# Patient Record
Sex: Male | Born: 1965 | Race: Black or African American | Hispanic: No | Marital: Single | Smoking: Current every day smoker
Health system: Southern US, Community
[De-identification: ages and names within clinical notes are randomized; demographics above are authoritative.]

## PROBLEM LIST (undated history)

## (undated) DIAGNOSIS — I10 Essential (primary) hypertension: Secondary | ICD-10-CM

## (undated) DIAGNOSIS — E119 Type 2 diabetes mellitus without complications: Secondary | ICD-10-CM

## (undated) DIAGNOSIS — K7460 Unspecified cirrhosis of liver: Secondary | ICD-10-CM

## (undated) DIAGNOSIS — M1990 Unspecified osteoarthritis, unspecified site: Secondary | ICD-10-CM

## (undated) DIAGNOSIS — J3489 Other specified disorders of nose and nasal sinuses: Secondary | ICD-10-CM

## (undated) HISTORY — PX: OTHER SURGICAL HISTORY: SHX169

---

## 2001-07-31 ENCOUNTER — Emergency Department (HOSPITAL_COMMUNITY): Admission: EM | Admit: 2001-07-31 | Discharge: 2001-07-31 | Payer: Self-pay | Admitting: Emergency Medicine

## 2001-08-07 ENCOUNTER — Ambulatory Visit (HOSPITAL_COMMUNITY): Admission: RE | Admit: 2001-08-07 | Discharge: 2001-08-07 | Payer: Self-pay | Admitting: Family Medicine

## 2001-08-07 ENCOUNTER — Encounter: Payer: Self-pay | Admitting: Family Medicine

## 2001-09-03 ENCOUNTER — Encounter: Payer: Self-pay | Admitting: Family Medicine

## 2001-09-03 ENCOUNTER — Ambulatory Visit (HOSPITAL_COMMUNITY): Admission: RE | Admit: 2001-09-03 | Discharge: 2001-09-03 | Payer: Self-pay | Admitting: Family Medicine

## 2003-09-06 DIAGNOSIS — D179 Benign lipomatous neoplasm, unspecified: Secondary | ICD-10-CM | POA: Insufficient documentation

## 2007-01-21 ENCOUNTER — Ambulatory Visit: Payer: Self-pay

## 2007-04-08 ENCOUNTER — Ambulatory Visit: Payer: Self-pay

## 2007-04-15 ENCOUNTER — Ambulatory Visit: Payer: Self-pay

## 2011-08-20 LAB — CBC
HCT: 39.8 % — ABNORMAL LOW (ref 40.0–52.0)
MCV: 87 fL (ref 80–100)
RBC: 4.56 10*6/uL (ref 4.40–5.90)
RDW: 16.5 % — ABNORMAL HIGH (ref 11.5–14.5)

## 2011-08-20 LAB — COMPREHENSIVE METABOLIC PANEL
Albumin: 4.5 g/dL (ref 3.4–5.0)
Alkaline Phosphatase: 65 U/L (ref 50–136)
BUN: 6 mg/dL — ABNORMAL LOW (ref 7–18)
Bilirubin,Total: 0.5 mg/dL (ref 0.2–1.0)
Calcium, Total: 9 mg/dL (ref 8.5–10.1)
Chloride: 105 mmol/L (ref 98–107)
Co2: 26 mmol/L (ref 21–32)
Creatinine: 1.33 mg/dL — ABNORMAL HIGH (ref 0.60–1.30)
Glucose: 95 mg/dL (ref 65–99)
Osmolality: 279 (ref 275–301)
Potassium: 3.5 mmol/L (ref 3.5–5.1)
SGOT(AST): 29 U/L (ref 15–37)
SGPT (ALT): 24 U/L
Sodium: 141 mmol/L (ref 136–145)

## 2011-08-20 LAB — SALICYLATE LEVEL: Salicylates, Serum: 2.4 mg/dL

## 2011-08-20 LAB — ETHANOL: Ethanol %: <0.003 % (ref 0.000–0.080)

## 2011-08-21 LAB — DRUG SCREEN, URINE
Amphetamines, Ur Screen: NEGATIVE (ref ?–1000)
Benzodiazepine, Ur Scrn: NEGATIVE (ref ?–200)
Cocaine Metabolite,Ur ~~LOC~~: POSITIVE (ref ?–300)
Methadone, Ur Screen: NEGATIVE (ref ?–300)
Opiate, Ur Screen: NEGATIVE (ref ?–300)
Tricyclic, Ur Screen: NEGATIVE (ref ?–1000)

## 2011-08-21 LAB — URINALYSIS, COMPLETE
Leukocyte Esterase: NEGATIVE
Ph: 5 (ref 4.5–8.0)
Specific Gravity: 1.03 (ref 1.003–1.030)
Squamous Epithelial: 2

## 2011-08-22 ENCOUNTER — Inpatient Hospital Stay: Payer: Self-pay | Admitting: Unknown Physician Specialty

## 2013-03-16 DIAGNOSIS — S41109A Unspecified open wound of unspecified upper arm, initial encounter: Secondary | ICD-10-CM | POA: Insufficient documentation

## 2013-03-20 DIAGNOSIS — S52209A Unspecified fracture of shaft of unspecified ulna, initial encounter for closed fracture: Secondary | ICD-10-CM | POA: Insufficient documentation

## 2013-03-20 DIAGNOSIS — S5290XA Unspecified fracture of unspecified forearm, initial encounter for closed fracture: Secondary | ICD-10-CM | POA: Insufficient documentation

## 2013-04-24 ENCOUNTER — Emergency Department (HOSPITAL_COMMUNITY)
Admission: EM | Admit: 2013-04-24 | Discharge: 2013-04-24 | Disposition: A | Discharge status: Home / Self Care | Payer: Self-pay | Attending: Emergency Medicine | Admitting: Emergency Medicine

## 2013-04-24 ENCOUNTER — Encounter (HOSPITAL_COMMUNITY): Payer: Self-pay | Admitting: Emergency Medicine

## 2013-04-24 DIAGNOSIS — I10 Essential (primary) hypertension: Secondary | ICD-10-CM | POA: Insufficient documentation

## 2013-04-24 DIAGNOSIS — Z87828 Personal history of other (healed) physical injury and trauma: Secondary | ICD-10-CM | POA: Insufficient documentation

## 2013-04-24 DIAGNOSIS — R42 Dizziness and giddiness: Secondary | ICD-10-CM | POA: Insufficient documentation

## 2013-04-24 DIAGNOSIS — R5381 Other malaise: Secondary | ICD-10-CM | POA: Insufficient documentation

## 2013-04-24 DIAGNOSIS — F17200 Nicotine dependence, unspecified, uncomplicated: Secondary | ICD-10-CM | POA: Insufficient documentation

## 2013-04-24 HISTORY — DX: Essential (primary) hypertension: I10

## 2013-04-24 MED ORDER — HYDROCHLOROTHIAZIDE 25 MG PO TABS: 25.0000 mg | ORAL_TABLET | Freq: Every day | ORAL | Status: DC

## 2013-04-24 NOTE — ED Provider Notes (Signed)
Medical screening examination/treatment/procedure(s) were performed by non-physician practitioner and as supervising physician I was immediately available for consultation/collaboration.  EKG Interpretation   None         Lyanne Co, MD 04/24/13 2055
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
## 2013-05-06 ENCOUNTER — Emergency Department (HOSPITAL_COMMUNITY)
Admission: EM | Admit: 2013-05-06 | Discharge: 2013-05-06 | Disposition: A | Discharge status: Home / Self Care | Payer: Self-pay | Attending: Emergency Medicine | Admitting: Emergency Medicine

## 2013-05-06 ENCOUNTER — Encounter (HOSPITAL_COMMUNITY): Payer: Self-pay | Admitting: Emergency Medicine

## 2013-05-06 ENCOUNTER — Emergency Department (HOSPITAL_COMMUNITY): Payer: Self-pay

## 2013-05-06 DIAGNOSIS — R42 Dizziness and giddiness: Secondary | ICD-10-CM | POA: Insufficient documentation

## 2013-05-06 DIAGNOSIS — Z87828 Personal history of other (healed) physical injury and trauma: Secondary | ICD-10-CM | POA: Insufficient documentation

## 2013-05-06 DIAGNOSIS — Z7982 Long term (current) use of aspirin: Secondary | ICD-10-CM | POA: Insufficient documentation

## 2013-05-06 DIAGNOSIS — I10 Essential (primary) hypertension: Secondary | ICD-10-CM | POA: Insufficient documentation

## 2013-05-06 DIAGNOSIS — L02419 Cutaneous abscess of limb, unspecified: Secondary | ICD-10-CM | POA: Insufficient documentation

## 2013-05-06 DIAGNOSIS — F17200 Nicotine dependence, unspecified, uncomplicated: Secondary | ICD-10-CM | POA: Insufficient documentation

## 2013-05-06 DIAGNOSIS — Z79899 Other long term (current) drug therapy: Secondary | ICD-10-CM | POA: Insufficient documentation

## 2013-05-06 DIAGNOSIS — IMO0001 Reserved for inherently not codable concepts without codable children: Secondary | ICD-10-CM | POA: Insufficient documentation

## 2013-05-06 DIAGNOSIS — R51 Headache: Secondary | ICD-10-CM | POA: Insufficient documentation

## 2013-05-06 MED ORDER — CEPHALEXIN 500 MG PO CAPS: 500.0000 mg | ORAL_CAPSULE | Freq: Four times a day (QID) | ORAL | Status: DC

## 2013-05-06 MED ORDER — SULFAMETHOXAZOLE-TRIMETHOPRIM 800-160 MG PO TABS: 1.0000 | ORAL_TABLET | Freq: Two times a day (BID) | ORAL | Status: AC
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
## 2013-05-06 NOTE — ED Notes (Signed)
?   Insect bite to rt leg, states it has puss and is red

## 2013-05-08 ENCOUNTER — Emergency Department (HOSPITAL_COMMUNITY)
Admission: EM | Admit: 2013-05-08 | Discharge: 2013-05-08 | Disposition: A | Discharge status: Home / Self Care | Payer: Self-pay | Attending: Emergency Medicine | Admitting: Emergency Medicine

## 2013-05-08 ENCOUNTER — Encounter (HOSPITAL_COMMUNITY): Payer: Self-pay | Admitting: Emergency Medicine

## 2013-05-08 DIAGNOSIS — F17200 Nicotine dependence, unspecified, uncomplicated: Secondary | ICD-10-CM | POA: Insufficient documentation

## 2013-05-08 DIAGNOSIS — Z79899 Other long term (current) drug therapy: Secondary | ICD-10-CM | POA: Insufficient documentation

## 2013-05-08 DIAGNOSIS — Z7982 Long term (current) use of aspirin: Secondary | ICD-10-CM | POA: Insufficient documentation

## 2013-05-08 DIAGNOSIS — L0291 Cutaneous abscess, unspecified: Secondary | ICD-10-CM

## 2013-05-08 DIAGNOSIS — I10 Essential (primary) hypertension: Secondary | ICD-10-CM | POA: Insufficient documentation

## 2013-05-08 DIAGNOSIS — L02419 Cutaneous abscess of limb, unspecified: Secondary | ICD-10-CM | POA: Insufficient documentation

## 2013-05-08 DIAGNOSIS — Z792 Long term (current) use of antibiotics: Secondary | ICD-10-CM | POA: Insufficient documentation

## 2013-05-08 DIAGNOSIS — Z87828 Personal history of other (healed) physical injury and trauma: Secondary | ICD-10-CM | POA: Insufficient documentation

## 2013-05-08 NOTE — ED Provider Notes (Signed)
Medical screening examination/treatment/procedure(s) were performed by non-physician practitioner and as supervising physician I was immediately available for consultation/collaboration.  EKG Interpretation   None         William Trinadee Verhagen, MD 05/08/13 2249 

## 2013-05-08 NOTE — ED Notes (Signed)
Patient states he was seen in the ED 2 days ago for an abscess to the right lower leg. Patient states he was told to come back for a recheck.
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
## 2013-05-11 NOTE — ED Provider Notes (Signed)
Medical screening examination/treatment/procedure(s) were conducted as a shared visit with non-physician practitioner(s) and myself.  I personally evaluated the patient during the encounter.  EKG Interpretation   None       Patient seen and evaluated. Bedside ultrasound shows small fluid collection. He has obvious chronic cellulitis. The calf itself is not until further tests to suggest compartment. His peripheral exam is normal normal sensation and use pulses and cap refill. Recommendation and treatment plan is as above with incision and drainage package antibiotics early recheck.  Rolland Porter, MD 05/11/13 2300

## 2013-05-18 NOTE — ED Provider Notes (Signed)
Medical screening examination/treatment/procedure(s) were conducted as a shared visit with non-physician practitioner(s) and myself.  I personally evaluated the patient during the encounter.  EKG Interpretation   None         Rolland Porter, MD 05/18/13 [phone]

## 2013-05-26 ENCOUNTER — Ambulatory Visit: Payer: Self-pay | Admitting: Occupational Therapy

## 2013-05-28 ENCOUNTER — Ambulatory Visit: Payer: Self-pay | Admitting: Occupational Therapy

## 2013-06-11 ENCOUNTER — Ambulatory Visit: Payer: Self-pay | Attending: *Deleted | Admitting: Occupational Therapy

## 2013-06-11 DIAGNOSIS — IMO0001 Reserved for inherently not codable concepts without codable children: Secondary | ICD-10-CM | POA: Insufficient documentation

## 2013-06-11 DIAGNOSIS — M6281 Muscle weakness (generalized): Secondary | ICD-10-CM | POA: Insufficient documentation

## 2013-06-11 DIAGNOSIS — M2560 Stiffness of unspecified joint, not elsewhere classified: Secondary | ICD-10-CM | POA: Insufficient documentation

## 2013-06-11 DIAGNOSIS — M2550 Pain in unspecified joint: Secondary | ICD-10-CM | POA: Insufficient documentation

## 2013-06-11 DIAGNOSIS — R279 Unspecified lack of coordination: Secondary | ICD-10-CM | POA: Insufficient documentation

## 2013-06-18 ENCOUNTER — Emergency Department: Payer: Self-pay | Admitting: Internal Medicine

## 2013-07-03 ENCOUNTER — Encounter: Payer: Self-pay | Admitting: *Deleted

## 2013-07-19 ENCOUNTER — Encounter: Payer: Self-pay | Admitting: *Deleted

## 2013-07-24 ENCOUNTER — Emergency Department: Payer: Self-pay | Admitting: Emergency Medicine

## 2013-07-24 LAB — BASIC METABOLIC PANEL
Anion Gap: 6 — ABNORMAL LOW (ref 7–16)
BUN: 7 mg/dL (ref 7–18)
CALCIUM: 9.2 mg/dL (ref 8.5–10.1)
CO2: 27 mmol/L (ref 21–32)
Chloride: 104 mmol/L (ref 98–107)
Creatinine: 1.21 mg/dL (ref 0.60–1.30)
EGFR (African American): >60
Glucose: 78 mg/dL (ref 65–99)
OSMOLALITY: 271 (ref 275–301)
Potassium: 3.9 mmol/L (ref 3.5–5.1)
SODIUM: 137 mmol/L (ref 136–145)

## 2013-07-24 LAB — CBC
HCT: 39.9 % — AB (ref 40.0–52.0)
HGB: 13.3 g/dL (ref 13.0–18.0)
MCH: 26.8 pg (ref 26.0–34.0)
MCHC: 33.3 g/dL (ref 32.0–36.0)
MCV: 81 fL (ref 80–100)
PLATELETS: 278 10*3/uL (ref 150–440)
RBC: 4.96 10*6/uL (ref 4.40–5.90)
RDW: 17.7 % — ABNORMAL HIGH (ref 11.5–14.5)
WBC: 5.5 10*3/uL (ref 3.8–10.6)

## 2013-07-24 LAB — TROPONIN I: Troponin-I: <0.02 ng/mL

## 2013-08-19 ENCOUNTER — Encounter: Payer: Self-pay | Admitting: *Deleted

## 2013-09-09 DIAGNOSIS — S52209B Unspecified fracture of shaft of unspecified ulna, initial encounter for open fracture type I or II: Secondary | ICD-10-CM | POA: Insufficient documentation

## 2013-09-09 DIAGNOSIS — S52309B Unspecified fracture of shaft of unspecified radius, initial encounter for open fracture type I or II: Secondary | ICD-10-CM | POA: Insufficient documentation

## 2013-09-18 ENCOUNTER — Encounter: Payer: Self-pay | Admitting: *Deleted

## 2013-11-12 DIAGNOSIS — E559 Vitamin D deficiency, unspecified: Secondary | ICD-10-CM | POA: Insufficient documentation

## 2014-08-01 ENCOUNTER — Emergency Department: Payer: Self-pay | Admitting: Internal Medicine

## 2014-08-01 ENCOUNTER — Ambulatory Visit: Payer: Self-pay | Admitting: Emergency Medicine
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
## 2014-09-12 NOTE — Consult Note (Signed)
PATIENT NAME:  Donald Oconnell, Donald Oconnell MR#:  [id number] DATE OF BIRTH:  August 18, 1965  DATE OF CONSULTATION:  08/20/2011  REFERRING PHYSICIAN:  Lowella Fairy, MD CONSULTING PHYSICIAN:  Venida Jarvis, MD  CHIEF COMPLAINT: "I'm not happy about life."   HISTORY OF PRESENT ILLNESS: The patient is a 49 year old black, single male transported to [facility] via police. He reported initially to the police he has no reason to live and was tired of life. He was slightly disheveled on admission with some poor hygiene and was noted to be vague with no psychotic-like symptoms. The patient reports to me substance abuse problems and chronic depression for most of his life with symptoms including decreased sleep, interest is questionable, concentration is down, appetite okay, energy is "I don't know", and for suicidal thoughts he reports he just cannot get rid of the pain and that he will do something, but does not have a particular plan and will not tell me very much more about it.   The patient has a long-term history of cocaine abuse, usually crack cocaine, dating back at least 20 years. I saw him 15 years ago during a hospitalization for cocaine. At that time he was irritable in the hospital with some difficulty controlling him. He reports using crack every three days about 2 grams or 100 dollars every three days. He drinks about a beer every 2 to 3 weeks and occasionally uses synthetic marijuana, but not very often. He has had several medication trials. I had him on Wellbutrin several years ago and he reports that did not help very much, but he does not remember for sure. According to my records during the hospitalization, he did get somewhat better with it. He also has questionable history of ADHD. He has history of personality disorder and probably going down hill. In addition, prior to precipitating event for hospitalization was that the patient had to sign his car over to drug dealers in order  to mollify them for medicine he owes.  FAMILY HISTORY: According to the patient is negative.   PAST HISTORY:  1. Hypertension on hydrochlorothiazide. 2. Psoriasis for which he takes apparently triamcinolone cream occasionally.   SOCIAL HISTORY: The patient has been in prison before for some drug charges as well as assault. He is currently not on probation, according to him. He has a son who he does not see currently. No recent hobbies. In the past, he enjoyed basketball and football. Formerly worked at [employer], now apparently not working. He lives by himself in a boarding house. He has a high school education.   MENTAL STATUS: This is a black male who looked his stated age, pretty vague in the interview with a slight depressed affect and started with at least somewhat of a manipulative tone. He was however well-groomed, aware of his surroundings, and able to sustain attention and change focus of attention. He was oriented x4, remembered three of three objects at 1 and 3 minutes.   IMPRESSION:   AXIS I:  1. Depressive disorder, not otherwise specified.  2. Cocaine dependence.   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   AXIS III: Psoriasis and hypertension.   ASSESSMENT AND PLAN: The patient does have some vague suicidal thinking, appears to be at the end of his rope at this point, also manipulative and probably somewhat sociopathic. With history of violence, I think we need to leave commitment papers on him and at least make an attempt  to [facility]. In the meantime, I will put him on some Wellbutrin as well as Remeron. A double dose may help some with depression. It is possible he could clear within a day or two. We will also place the patient back on hydrochlorothiazide. He probably is too potentially violent to be admitted to the psychiatric unit here.  ____________________________ Venida JarvisWilliam James Ryan II, MD wjr:slb D: 08/20/2011  14:51:52 ET     T: 08/20/2011 15:55:15 ET        JOB#: [id number] cc: Venida JarvisWilliam James Ryan II, MD, <Dictator> Jules HusbandsWILLIAM J RYAN MD ELECTRONICALLY SIGNED 08/23/2011 14:51

## 2014-09-12 NOTE — Discharge Summary (Signed)
PATIENT NAME:  Donald Oconnell, Donald Oconnell MR#:  [id number] DATE OF BIRTH:  [birth year]  DATE OF ADMISSION:  08/22/2011 DATE OF DISCHARGE:  08/24/2011  HISTORY OF PRESENT ILLNESS: The patient was admitted with depressive disorder, not otherwise specified, and cocaine dependence. For further details, please see typed attached History and Physical.   ACCESSORY CLINICAL DATA:  Acetaminophen was negative.  BUN 6, creatinine 1.33, otherwise kidneys within normal limits. Glucose, electrolytes, liver function tests within normal limits. Calcium within normal limits.  Ethanol was negative.  CBC within normal limits.  Salicylates 2.4. TSH 0.630, within normal limits.  Drug screen positive for cocaine.  Urinalysis showed 1+ bilirubin, 30 mg of protein, otherwise negative.   HOSPITAL COURSE: The patient was begun on Wellbutrin 150 and then Remeron 15, which was eventually increased to 30. Over the course of the hospitalization, depressive symptoms responded and he became a good bit less manipulative and indecisive. At the time of discharge, he continued to want help with both depression and with the problem with cocaine, and he also desperately was trying look for a job. He did agree to attend [facility], had no suicidal thinking. Interest and energy were much better at the time of discharge.   DIAGNOSES:  AXIS I:  1. Depressive disorder, not otherwise specified.  2. Cocaine dependence.   AXIS III:  1. Hypertension.  2. Psoriasis.   CONDITION ON DISCHARGE: Good.  DISPOSITION: The patient will be seen at [facility] at [facility].   DISCHARGE MEDICATIONS:  1. Wellbutrin SR 150 daily.  2. Remeron 30 mg at bedtime. 3. Hydrochlorothiazide 25 mg daily.   DIET: As tolerated.  ACTIVITY: As tolerated.  ____________________________ Venida JarvisWilliam James Mubashir Mallek II, MD wjr:cbb D: 08/24/2011 12:52:59 ET T: 08/24/2011 13:41:23 ET JOB#: [id number]  cc: Venida JarvisWilliam James Sutton Hirsch II, MD, <Dictator> Jules HusbandsWILLIAM J Patrick Salemi MD ELECTRONICALLY  SIGNED 08/28/2011 15:35

## 2014-09-13 IMAGING — CR DG TIBIA/FIBULA 2V*R*
4 series · 4 of 4 positions shown · non-contrast
Comparison: None.

CLINICAL DATA: Insect bite.  Evaluate for free air.

EXAM:
RIGHT TIBIA AND FIBULA - 2 VIEW

[x tib-fib ap right (1 of 2)]
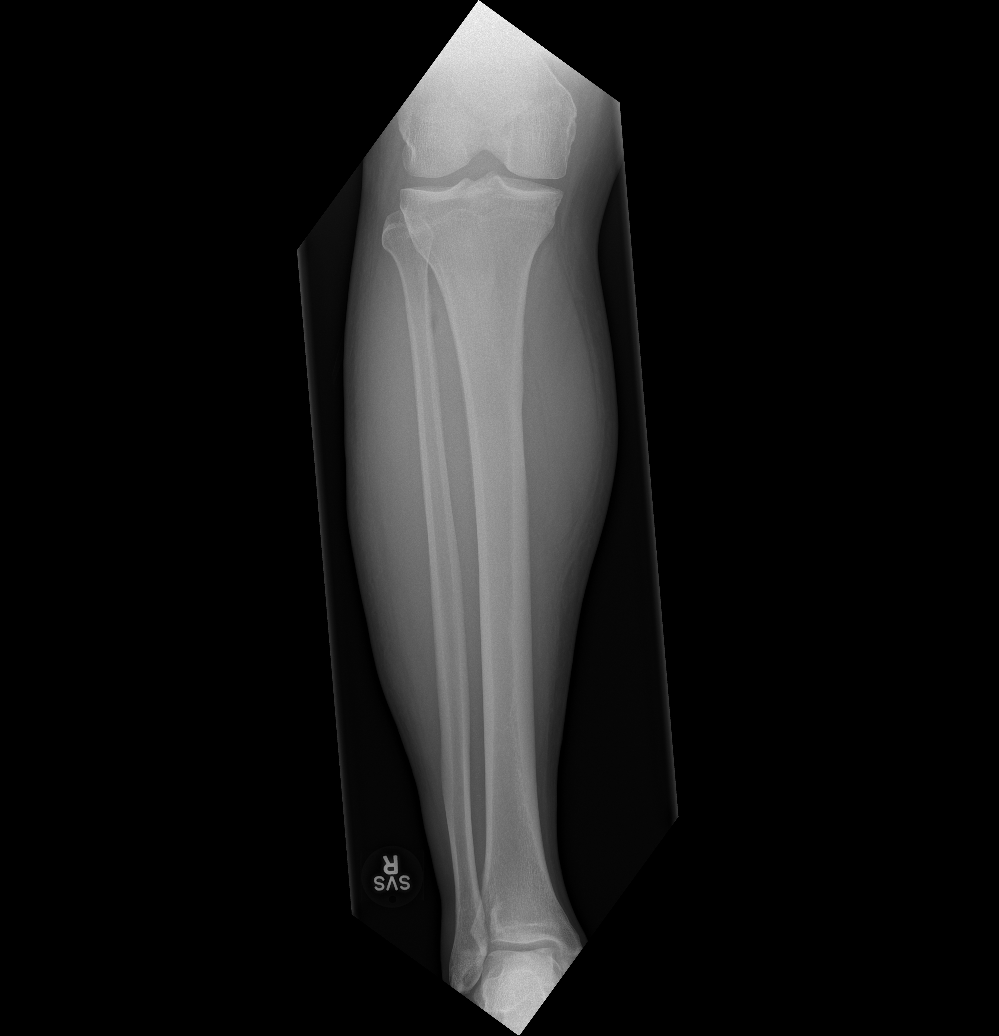

[x tib-fib ap right (2 of 2)]
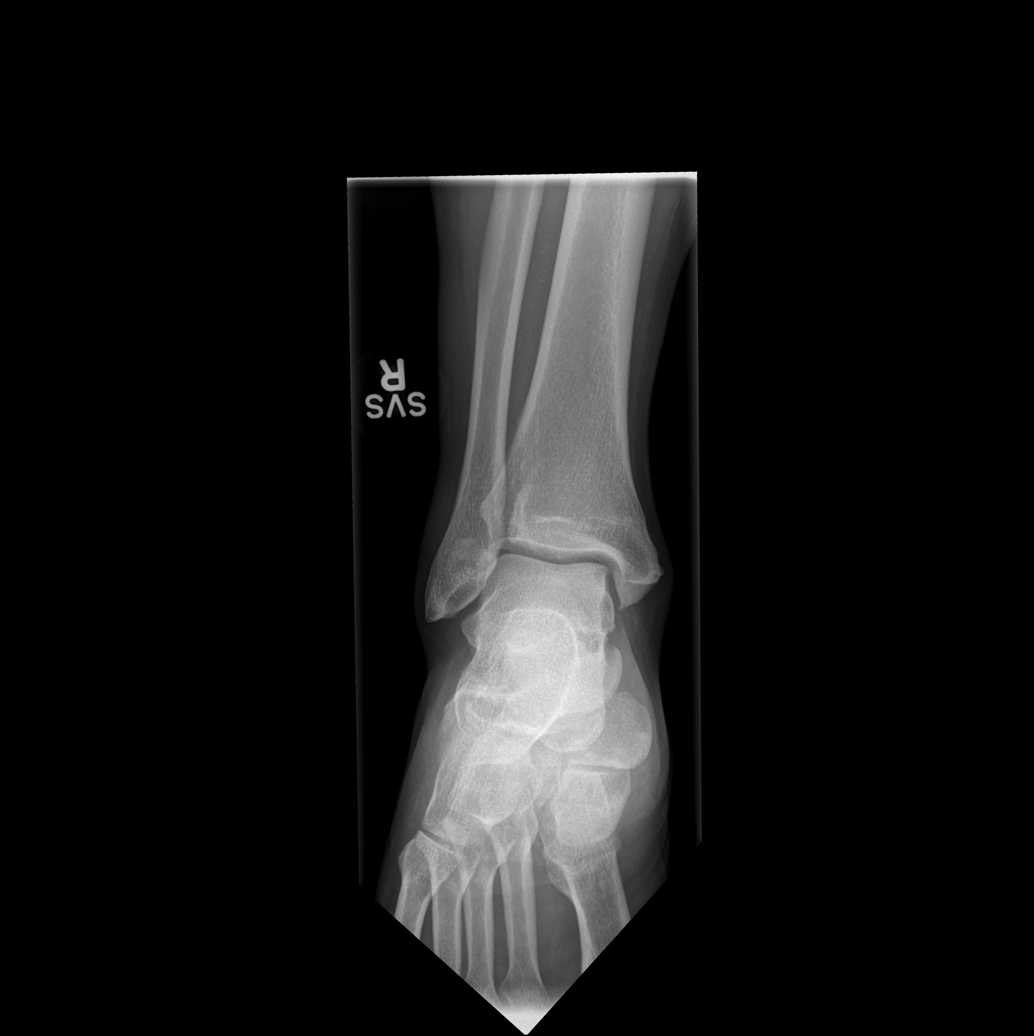

[x tib-fib lat right (1 of 2)]
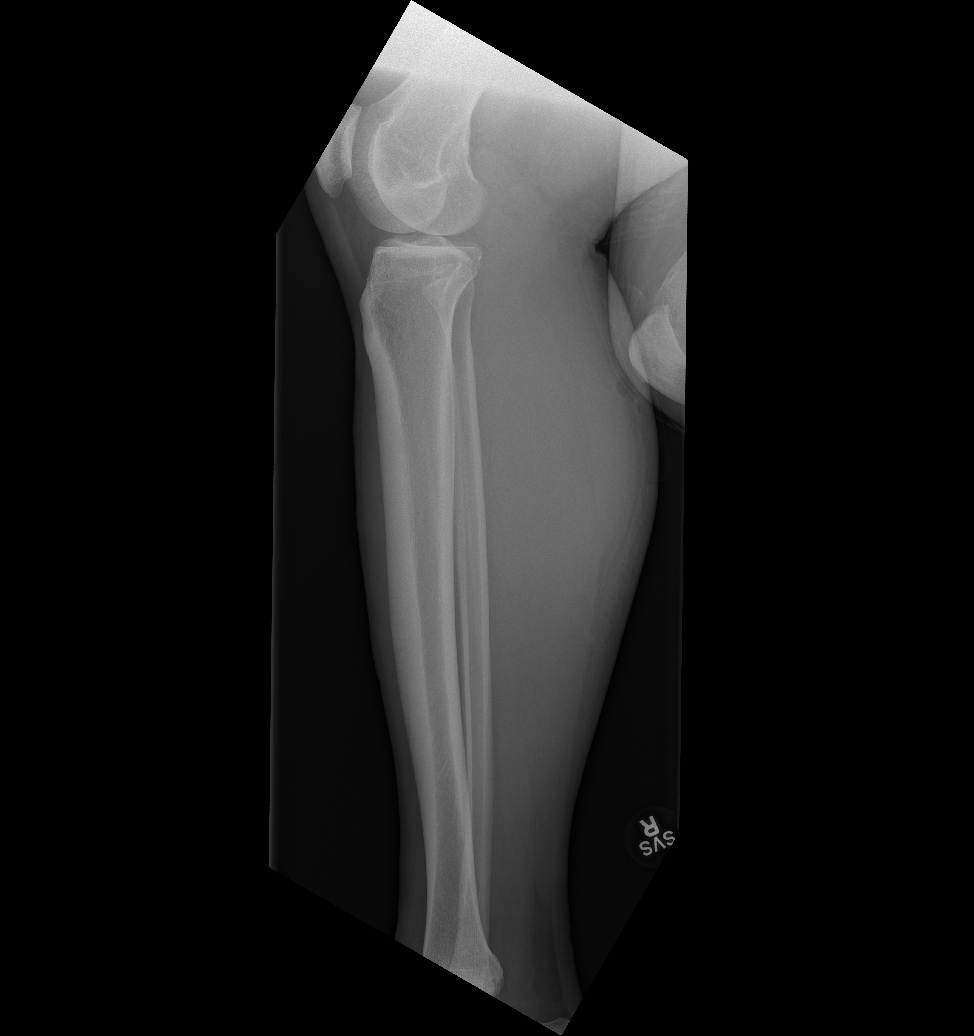

[x tib-fib lat right (2 of 2)]
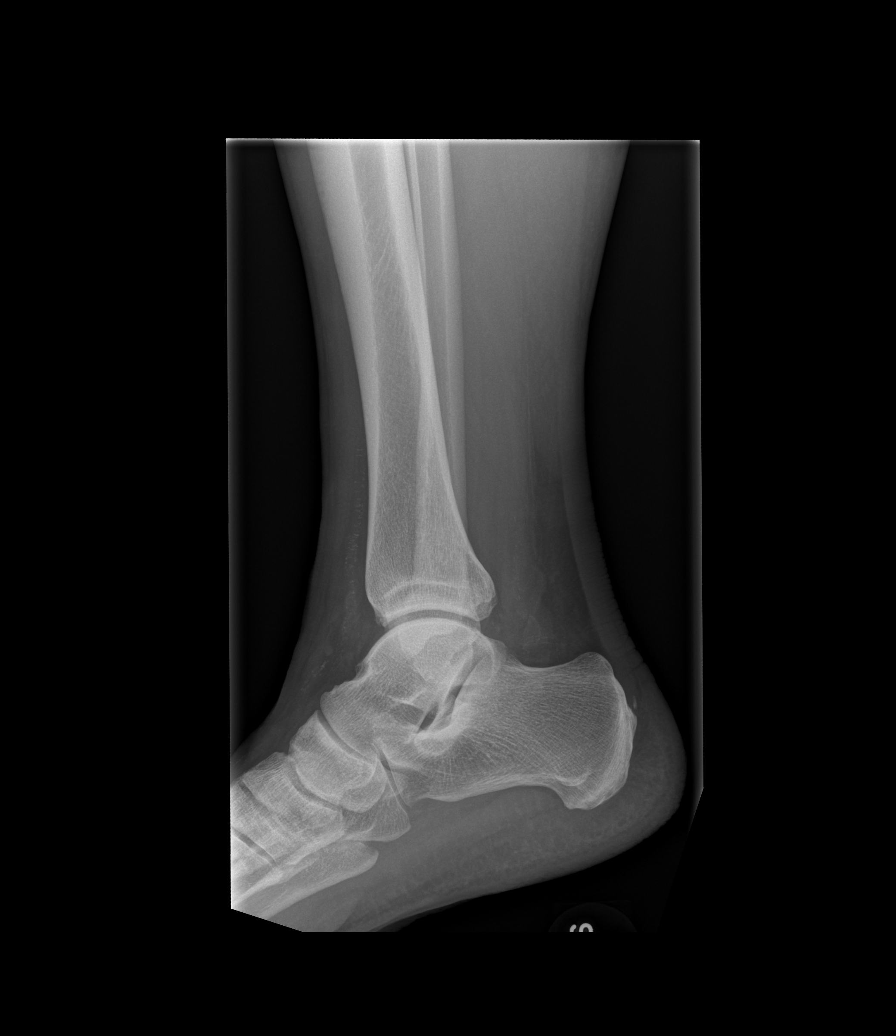

[4 of 4 positions shown; findings below may reference images not displayed]

FINDINGS: 13 mm locule of subcutaneous gas in the upper calf. There is soft
tissue reticulation in this region. No evidence of osseous
infection. No fracture. Arterial calcification.

These results were called by telephone at the time of interpretation
on 05/06/2013 at [DATE] to Dr. VAZQUEZ BOULWARE , who verbally
acknowledged these results.
IMPRESSION: 1. Cellulitic change with single locule of subcutaneous gas in the
upper calf.
2. Negative for osseous infection.

## 2015-01-25 ENCOUNTER — Emergency Department
Admission: EM | Admit: 2015-01-25 | Discharge: 2015-01-26 | Disposition: A | Discharge status: Other Acute Inpatient Hospital | Payer: Self-pay | Attending: Emergency Medicine | Admitting: Emergency Medicine

## 2015-01-25 ENCOUNTER — Emergency Department: Payer: Self-pay

## 2015-01-25 ENCOUNTER — Encounter: Payer: Self-pay | Admitting: Emergency Medicine

## 2015-01-25 DIAGNOSIS — Z79899 Other long term (current) drug therapy: Secondary | ICD-10-CM | POA: Insufficient documentation

## 2015-01-25 DIAGNOSIS — Y9389 Activity, other specified: Secondary | ICD-10-CM | POA: Insufficient documentation

## 2015-01-25 DIAGNOSIS — L409 Psoriasis, unspecified: Secondary | ICD-10-CM

## 2015-01-25 DIAGNOSIS — E1159 Type 2 diabetes mellitus with other circulatory complications: Secondary | ICD-10-CM

## 2015-01-25 DIAGNOSIS — F1994 Other psychoactive substance use, unspecified with psychoactive substance-induced mood disorder: Secondary | ICD-10-CM

## 2015-01-25 DIAGNOSIS — I10 Essential (primary) hypertension: Secondary | ICD-10-CM

## 2015-01-25 DIAGNOSIS — Y998 Other external cause status: Secondary | ICD-10-CM | POA: Insufficient documentation

## 2015-01-25 DIAGNOSIS — X58XXXA Exposure to other specified factors, initial encounter: Secondary | ICD-10-CM | POA: Insufficient documentation

## 2015-01-25 DIAGNOSIS — F1210 Cannabis abuse, uncomplicated: Secondary | ICD-10-CM | POA: Insufficient documentation

## 2015-01-25 DIAGNOSIS — Y9289 Other specified places as the place of occurrence of the external cause: Secondary | ICD-10-CM | POA: Insufficient documentation

## 2015-01-25 DIAGNOSIS — F1410 Cocaine abuse, uncomplicated: Secondary | ICD-10-CM

## 2015-01-25 DIAGNOSIS — F22 Delusional disorders: Secondary | ICD-10-CM | POA: Insufficient documentation

## 2015-01-25 DIAGNOSIS — Z720 Tobacco use: Secondary | ICD-10-CM | POA: Insufficient documentation

## 2015-01-25 DIAGNOSIS — S62630A Displaced fracture of distal phalanx of right index finger, initial encounter for closed fracture: Secondary | ICD-10-CM | POA: Insufficient documentation

## 2015-01-25 DIAGNOSIS — Z7982 Long term (current) use of aspirin: Secondary | ICD-10-CM | POA: Insufficient documentation

## 2015-01-25 DIAGNOSIS — F341 Dysthymic disorder: Secondary | ICD-10-CM

## 2015-01-25 DIAGNOSIS — R443 Hallucinations, unspecified: Secondary | ICD-10-CM

## 2015-01-25 LAB — COMPREHENSIVE METABOLIC PANEL
ALBUMIN: 4.6 g/dL (ref 3.5–5.0)
ALT: 19 U/L (ref 17–63)
AST: 27 U/L (ref 15–41)
Alkaline Phosphatase: 71 U/L (ref 38–126)
Anion gap: 10 (ref 5–15)
BUN: 13 mg/dL (ref 6–20)
CHLORIDE: 107 mmol/L (ref 101–111)
CO2: 24 mmol/L (ref 22–32)
Calcium: 9.5 mg/dL (ref 8.9–10.3)
Creatinine, Ser: 1.57 mg/dL — ABNORMAL HIGH (ref 0.61–1.24)
GFR calc Af Amer: 58 mL/min — ABNORMAL LOW (ref >60)
GFR, EST NON AFRICAN AMERICAN: 50 mL/min — AB (ref >60)
GLUCOSE: 92 mg/dL (ref 65–99)
POTASSIUM: 3.5 mmol/L (ref 3.5–5.1)
Sodium: 141 mmol/L (ref 135–145)
Total Bilirubin: 0.6 mg/dL (ref 0.3–1.2)
Total Protein: 8.3 g/dL — ABNORMAL HIGH (ref 6.5–8.1)

## 2015-01-25 LAB — CBC
HCT: 43.1 % (ref 40.0–52.0)
Hemoglobin: 14.2 g/dL (ref 13.0–18.0)
MCH: 27.9 pg (ref 26.0–34.0)
MCHC: 33 g/dL (ref 32.0–36.0)
MCV: 84.6 fL (ref 80.0–100.0)
PLATELETS: 304 10*3/uL (ref 150–440)
RBC: 5.09 MIL/uL (ref 4.40–5.90)
RDW: 17.3 % — AB (ref 11.5–14.5)
WBC: 7.7 10*3/uL (ref 3.8–10.6)

## 2015-01-25 LAB — TROPONIN I

## 2015-01-25 LAB — URINE DRUG SCREEN, QUALITATIVE (ARMC ONLY)
AMPHETAMINES, UR SCREEN: NOT DETECTED
BENZODIAZEPINE, UR SCRN: NOT DETECTED
Barbiturates, Ur Screen: NOT DETECTED
Cannabinoid 50 Ng, Ur ~~LOC~~: POSITIVE — AB
Cocaine Metabolite,Ur ~~LOC~~: POSITIVE — AB
MDMA (Ecstasy)Ur Screen: NOT DETECTED
METHADONE SCREEN, URINE: NOT DETECTED
Opiate, Ur Screen: NOT DETECTED
Phencyclidine (PCP) Ur S: NOT DETECTED
Tricyclic, Ur Screen: NOT DETECTED

## 2015-01-25 LAB — ETHANOL: Alcohol, Ethyl (B): <5 mg/dL (ref <5)

## 2015-01-25 LAB — LIPASE, BLOOD: Lipase: 20 U/L — ABNORMAL LOW (ref 22–51)

## 2015-01-25 MED ORDER — LORAZEPAM 2 MG PO TABS
ORAL_TABLET | ORAL | Status: AC
Start: 2015-01-25 — End: 2015-01-26
  Filled 2015-01-25: qty 1

## 2015-01-25 MED ORDER — LORAZEPAM 2 MG PO TABS
2.0000 mg | ORAL_TABLET | Freq: Once | ORAL | Status: AC
  Administered 2015-01-25: 2 mg via ORAL

## 2015-01-25 MED ORDER — MIRTAZAPINE 15 MG PO TABS
30.0000 mg | ORAL_TABLET | Freq: Every day | ORAL | Status: DC
  Administered 2015-01-25: 30 mg via ORAL

## 2015-01-25 MED ORDER — HYDROCHLOROTHIAZIDE 25 MG PO TABS
ORAL_TABLET | ORAL | Status: AC
  Filled 2015-01-25: qty 2

## 2015-01-25 MED ORDER — QUETIAPINE FUMARATE 25 MG PO TABS
ORAL_TABLET | ORAL | Status: AC
  Filled 2015-01-25: qty 2

## 2015-01-25 MED ORDER — LORAZEPAM 1 MG PO TABS
ORAL_TABLET | ORAL | Status: AC
  Administered 2015-01-25: 10:00:00 2 mg via ORAL
  Filled 2015-01-25: qty 2

## 2015-01-25 MED ORDER — HYDROCHLOROTHIAZIDE 50 MG PO TABS
50.0000 mg | ORAL_TABLET | Freq: Every day | ORAL | Status: DC
  Administered 2015-01-25 – 2015-01-26 (×2): 50 mg via ORAL
  Filled 2015-01-25: qty 1

## 2015-01-25 MED ORDER — QUETIAPINE FUMARATE 25 MG PO TABS
50.0000 mg | ORAL_TABLET | Freq: Every day | ORAL | Status: DC
  Administered 2015-01-25: 50 mg via ORAL

## 2015-01-25 MED ORDER — MIRTAZAPINE 15 MG PO TABS
ORAL_TABLET | ORAL | Status: AC
  Filled 2015-01-25: qty 2

## 2015-01-25 NOTE — ED Notes (Signed)
Intake in to see pt at this time.

## 2015-01-25 NOTE — ED Notes (Signed)
Pt given breakfast tray, pt drank juice

## 2015-01-25 NOTE — ED Notes (Signed)
Entered in error wrong patient.  

## 2015-01-25 NOTE — ED Notes (Signed)
BEHAVIORAL HEALTH ROUNDING Patient sleeping: Yes.   Patient alert and oriented: yes Behavior appropriate: Yes.  ; If no, describe:  Nutrition and fluids offered: Yes  Toileting and hygiene offered: Yes  Sitter present: yes Law enforcement present: Yes  

## 2015-01-25 NOTE — ED Notes (Signed)
BEHAVIORAL HEALTH ROUNDING Patient sleeping: No. Patient alert and oriented: yes Behavior appropriate: Yes.  ; If no, describe:  Nutrition and fluids offered: Yes  Toileting and hygiene offered: Yes  Sitter present: yes Law enforcement present: Yes  

## 2015-01-25 NOTE — ED Notes (Signed)
Pt states "I hear that security officer talking to me, I want to leave, talking about me like I am a piece of trash", pt states he feels anxious and asked for something to calm him down, MD notified, PO ativan given

## 2015-01-25 NOTE — ED Notes (Signed)
CIWA entered in error - wrong patient.
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
## 2015-01-25 NOTE — BHH Counselor (Signed)
Counselor attempted to see PT to complete assessment. PT was sleeping. PT's nurse Maxine Glenn, RN) will contact TTS when he awakes.

## 2015-01-25 NOTE — ED Notes (Signed)
Dr Toni Amend in to eval

## 2015-01-25 NOTE — ED Notes (Signed)

## 2015-01-25 NOTE — ED Notes (Signed)
BEHAVIORAL HEALTH ROUNDING Patient sleeping: Yes.   Patient alert and oriented: yes Behavior appropriate: Yes.  ; If no, describe:  Nutrition and fluids offered: Yes  Toileting and hygiene offered: Yes  Sitter present: not applicable Law enforcement present: Yes   15 minute checks maintained for safety. 

## 2015-01-25 NOTE — ED Notes (Signed)
Meal tray given 

## 2015-01-25 NOTE — ED Notes (Signed)
Per [facility], pt noted attempting to remove IV from site. Quad RN placed bandage over site to maintain IV line. No other behaviors noted.

## 2015-01-25 NOTE — ED Notes (Signed)
ED BHU PLACEMENT JUSTIFICATION Is the patient under IVC or is there intent for IVC: Yes.   Is the patient medically cleared: Yes.   Is there vacancy in the ED BHU: Yes.   Is the population mix appropriate for patient: Yes.   Is the patient awaiting placement in inpatient or outpatient setting: Yes.   Has the patient had a psychiatric consult: No. Survey of unit performed for contraband, proper placement and condition of furniture, tampering with fixtures in bathroom, shower, and each patient room: Yes.  ; Findings:  APPEARANCE/BEHAVIOR calm, cooperative and adequate rapport can be established NEURO ASSESSMENT Orientation: time, place and person Hallucinations: No.None noted (Hallucinations) Speech: Normal Gait: normal RESPIRATORY ASSESSMENT Normal expansion.  Clear to auscultation.  No rales, rhonchi, or wheezing. CARDIOVASCULAR ASSESSMENT regular rate and rhythm, S1, S2 normal, no murmur, click, rub or gallop GASTROINTESTINAL ASSESSMENT soft, nontender, BS WNL, no r/g EXTREMITIES normal strength, tone, and muscle mass PLAN OF CARE Provide calm/safe environment. Vital signs assessed twice daily. ED BHU Assessment once each 12-hour shift. Collaborate with intake RN daily or as condition indicates. Assure the ED provider has rounded once each shift. Provide and encourage hygiene. Provide redirection as needed. Assess for escalating behavior; address immediately and inform ED provider.  Assess family dynamic and appropriateness for visitation as needed: Yes.  ; If necessary, describe findings:  Educate the patient/family about BHU procedures/visitation: Yes.  ; If necessary, describe findings:  

## 2015-01-25 NOTE — ED Notes (Addendum)
Patient cooperative with nursing assessment but still easily irritated and paranoid. Received Ativan 2 mg per MD order to assist with reducing agitation.  Will maintain on 15 minute checks for safety.

## 2015-01-25 NOTE — ED Notes (Signed)
Pt arrives via EMS after calling 911 stating that he had taken cocaine.  States he felt as if he was going to die.  Pt also had taken unknown substances.  Pt combative with EMS.  Dr. Zenda Alpers at bedside.

## 2015-01-25 NOTE — ED Notes (Signed)

## 2015-01-25 NOTE — ED Notes (Signed)
BEHAVIORAL HEALTH ROUNDING Patient sleeping: Yes.   Patient alert and oriented: yes Behavior appropriate: Yes.  ; If no, describe:  Nutrition and fluids offered: Yes  Toileting and hygiene offered: Yes  Sitter present: not applicable Law enforcement present: Yes  

## 2015-01-25 NOTE — ED Notes (Signed)
BEHAVIORAL HEALTH ROUNDING Patient sleeping: No. Patient alert and oriented: yes Behavior appropriate: Yes.   Nutrition and fluids offered: Yes  Toileting and hygiene offered: Yes  Sitter present: yes Law enforcement present: Yes   ENVIRONMENTAL ASSESSMENT Potentially harmful objects out of patient reach: Yes.   Personal belongings secured: Yes.   Patient dressed in hospital provided attire only: Yes.   Plastic bags out of patient reach: Yes.   Patient care equipment (cords, cables, call bells, lines, and drains) shortened, removed, or accounted for: Yes.   Equipment and supplies removed from bottom of stretcher: Yes.   Potentially toxic materials out of patient reach: Yes.   Sharps container removed or out of patient reach: Yes.      Pt given apple juice, when asked if he was hearing voices pt stated "Its hard to explain", when asked about SI pt states "With all the police out there I dont think so", at this time pt calm, claiming a headache

## 2015-01-25 NOTE — ED Notes (Signed)
Pt states that he hears voices and they talk to him telling him that he is going to die.  Pt states that he also talks back to the voices in his head.  Pt overhead saying that someone "put something in his cocaine".

## 2015-01-25 NOTE — ED Notes (Signed)
Patient resting comfortably in room. No complaints or concerns voiced. No distress or abnormal behavior noted. Will continue to monitor with security cameras. Q 15 minute rounds continue. 

## 2015-01-25 NOTE — ED Notes (Signed)
Pt moved to BHU 2. Report given to Amy RN from BMU.

## 2015-01-25 NOTE — ED Notes (Signed)
BEHAVIORAL HEALTH ROUNDING Patient sleeping: Yes.   Patient alert and oriented: yes Behavior appropriate: Yes.   Nutrition and fluids offered: Yes  Toileting and hygiene offered: Yes  Sitter present: yes Law enforcement present: Yes   Pt resting in bed, respirations even and unlabored

## 2015-01-25 NOTE — ED Notes (Signed)
BEHAVIORAL HEALTH ROUNDING Patient sleeping: yes Patient alert and oriented: no pt sleeping Behavior appropriate: Yes.  ; If no, describe:  Nutrition and fluids offered: Yes  Toileting and hygiene offered: Yes  Sitter present: yes Law enforcement present: Yes

## 2015-01-25 NOTE — ED Notes (Signed)
Pt co right 2nd finger pain, states "I jammed it".  Slight swelling noted, states wants xray in am.  Order given by md will send for xray in am.

## 2015-01-25 NOTE — ED Notes (Signed)
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
## 2015-01-25 NOTE — ED Notes (Signed)
Pt transferred into ED BHU room 2    Patient assigned to appropriate care area. Patient oriented to unit/care area: Informed that, for their safety, care areas are designed for safety and monitored by security cameras at all times; Visiting hours and phone times explained to patient. Patient verbalizes understanding, and verbal contract for safety obtained.   Psoriasis noted on extremities  - pt verbalizes that he takes a pill and has some cream for it - scripts filled at [facility] [address]
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
## 2015-01-25 NOTE — ED Notes (Signed)
Report received from [facility] RN. Patient care assumed. Patient/RN introduction complete. Pt sitting in day room, no distress or complaints at this time.  Pt awaiting to be seen by psychiatrist, Will continue to monitor.

## 2015-01-25 NOTE — ED Notes (Signed)
Pt IVC.  Engineer, materials outside room 17.

## 2015-01-26 ENCOUNTER — Emergency Department: Payer: Self-pay

## 2015-01-26 NOTE — ED Notes (Signed)

## 2015-01-26 NOTE — ED Notes (Signed)
No change in condition, will continue to monitor.  

## 2015-01-26 NOTE — ED Notes (Signed)
Pt C/O pain and swelling to his R index finger. Pt states he is unable to bend his finger. Swelling noted at this time and pt is unable to bend his finger.

## 2015-01-26 NOTE — ED Notes (Signed)
BEHAVIORAL HEALTH ROUNDING Patient sleeping: No. Patient alert and oriented: yes Behavior appropriate: Yes.  ; If no, describe:  Nutrition and fluids offered: Yes  Toileting and hygiene offered: Yes  Sitter present: no Law enforcement present: Yes  

## 2015-01-26 NOTE — ED Notes (Signed)
BEHAVIORAL HEALTH ROUNDING Patient sleeping: No. Patient alert and oriented: yes Behavior appropriate: Yes.  ; If no, describe:  Nutrition and fluids offered: Yes Toileting and hygiene offered: Yes  Sitter present: yes Law enforcement present: Yes ODS  

## 2015-01-26 NOTE — Discharge Instructions (Signed)
Please leave your splint in place, call the number provided for orthopedics to arrange a follow-up appointment. Please follow-up with [facility] regarding substance abuse and depression. Return to the emergency department if you're having any thoughts of hurting herself or anybody else so that we may attempt to help you.   Finger Fracture Fractures of fingers are breaks in the bones of the fingers. There are many types of fractures. There are different ways of treating these fractures. Your health care provider will discuss the best way to treat your fracture. CAUSES Traumatic injury is the main cause of broken fingers. These include:  Injuries while playing sports.  Workplace injuries.  Falls. RISK FACTORS Activities that can increase your risk of finger fractures include:  Sports.  Workplace activities that involve machinery.  A condition called osteoporosis, which can make your bones less dense and cause them to fracture more easily. SIGNS AND SYMPTOMS The main symptoms of a broken finger are pain and swelling within 15 minutes after the injury. Other symptoms include:  Bruising of your finger.  Stiffness of your finger.  Numbness of your finger.  Exposed bones (compound fracture) if the fracture is severe. DIAGNOSIS  The best way to diagnose a broken bone is with X-ray imaging. Additionally, your health care provider will use this X-ray image to evaluate the position of the broken finger bones.  TREATMENT  Finger fractures can be treated with:   Nonreduction--This means the bones are in place. The finger is splinted without changing the positions of the bone pieces. The splint is usually left on for about a week to 10 days. This will depend on your fracture and what your health care provider thinks.  Closed reduction--The bones are put back into position without using surgery. The finger is then splinted.  Open reduction and internal fixation--The fracture site is opened. Then  the bone pieces are fixed into place with pins or some type of hardware. This is seldom required. It depends on the severity of the fracture. HOME CARE INSTRUCTIONS   Follow your health care provider's instructions regarding activities, exercises, and physical therapy.  Only take over-the-counter or prescription medicines for pain, discomfort, or fever as directed by your health care provider. SEEK MEDICAL CARE IF: You have pain or swelling that limits the motion or use of your fingers. SEEK IMMEDIATE MEDICAL CARE IF:  Your finger becomes numb. MAKE SURE YOU:   Understand these instructions.  Will watch your condition.  Will get help right away if you are not doing well or get worse. Document Released: 08/19/2000 Document Revised: 02/25/2013 Document Reviewed: 12/17/2012 [facility] Patient Information 2015 [place], [place]. This information is not intended to replace advice given to you by your health care provider. Make sure you discuss any questions you have with your health care provider.  Polysubstance Abuse When people abuse more than one drug or type of drug it is called polysubstance or polydrug abuse. For example, many smokers also drink alcohol. This is one form of polydrug abuse. Polydrug abuse also refers to the use of a drug to counteract an unpleasant effect produced by another drug. It may also be used to help with withdrawal from another drug. People who take stimulants may become agitated. Sometimes this agitation is countered with a tranquilizer. This helps protect against the unpleasant side effects. Polydrug abuse also refers to the use of different drugs at the same time.  Anytime drug use is interfering with normal living activities, it has become abuse. This includes  problems with family and friends. Psychological dependence has developed when your mind tells you that the drug is needed. This is usually followed by physical dependence which has developed when continuing  increases of drug are required to get the same feeling or "high". This is known as addiction or chemical dependency. A person's risk is much higher if there is a history of chemical dependency in the family. SIGNS OF CHEMICAL DEPENDENCY  You have been told by friends or family that drugs have become a problem.  You fight when using drugs.  You are having blackouts (not remembering what you do while using).  You feel sick from using drugs but continue using.  You lie about use or amounts of drugs (chemicals) used.  You need chemicals to get you going.  You are suffering in work performance or in school because of drug use.  You get sick from use of drugs but continue to use anyway.  You need drugs to relate to people or feel comfortable in social situations.  You use drugs to forget problems. "Yes" answered to any of the above signs of chemical dependency indicates there are problems. The longer the use of drugs continues, the greater the problems will become. If there is a family history of drug or alcohol use, it is best not to experiment with these drugs. Continual use leads to tolerance. After tolerance develops more of the drug is needed to get the same feeling. This is followed by addiction. With addiction, drugs become the most important part of life. It becomes more important to take drugs than participate in the other usual activities of life. This includes relating to friends and family. Addiction is followed by dependency. Dependency is a condition where drugs are now needed not just to get high, but to feel normal. Addiction cannot be cured but it can be stopped. This often requires outside help and the care of professionals. Treatment centers are listed in the yellow pages under: Cocaine, Narcotics, and Alcoholics Anonymous. Most hospitals and clinics can refer you to a specialized care center. Talk to your caregiver if you need help. Document Released: 12/27/2004 Document  Revised: 07/30/2011 Document Reviewed: 05/07/2005 [facility] Patient Information 2015 [place], [place]. This information is not intended to replace advice given to you by your health care provider. Make sure you discuss any questions you have with your health care provider.

## 2015-01-26 NOTE — ED Notes (Signed)
Pt laying in bed.  

## 2015-01-26 NOTE — ED Notes (Signed)
Pt visualized in NAD. Pt sitting in the day room talking with another patient.

## 2015-01-26 NOTE — ED Notes (Signed)
Pt given supper tray.

## 2015-01-26 NOTE — ED Notes (Signed)

## 2015-01-26 NOTE — ED Notes (Signed)
NAD noted at time of D/C. Pt denies questions or concerns. Pt ambulatory to the lobby at this time.  

## 2015-01-26 NOTE — ED Notes (Signed)
ED BHU PLACEMENT JUSTIFICATION Is the patient under IVC or is there intent for IVC: Yes.   Is the patient medically cleared: Yes.   Is there vacancy in the ED BHU: Yes.   Is the population mix appropriate for patient: Yes.   Is the patient awaiting placement in inpatient or outpatient setting: Yes.   Has the patient had a psychiatric consult: Yes.   Survey of unit performed for contraband, proper placement and condition of furniture, tampering with fixtures in bathroom, shower, and each patient room: Yes.  ; Findings: all clear APPEARANCE/BEHAVIOR calm, cooperative and adequate rapport can be established NEURO ASSESSMENT Orientation: time, place and person Hallucinations: No.None noted (Hallucinations) Speech: Normal Gait: normal RESPIRATORY ASSESSMENT Normal expansion.  Clear to auscultation.  No rales, rhonchi, or wheezing. CARDIOVASCULAR ASSESSMENT regular rate and rhythm, S1, S2 normal, no murmur, click, rub or gallop GASTROINTESTINAL ASSESSMENT soft, nontender, BS WNL, no r/g EXTREMITIES normal strength, tone, and muscle mass PLAN OF CARE Provide calm/safe environment. Vital signs assessed twice daily. ED BHU Assessment once each 12-hour shift. Collaborate with intake RN daily or as condition indicates. Assure the ED provider has rounded once each shift. Provide and encourage hygiene. Provide redirection as needed. Assess for escalating behavior; address immediately and inform ED provider.  Assess family dynamic and appropriateness for visitation as needed: Yes.  ; If necessary, describe findings:  Educate the patient/family about BHU procedures/visitation: Yes.  ; If necessary, describe findings:

## 2015-01-26 NOTE — [facility] Counselor (Signed)
Per request of Psych MD (Dr. Toni Amend), writer provided the pt. with information and instructions on how to access Out Pt. Mental Health & Substance  Treatment ([facility] and [employer]). Writer also provided patient with contact information for [facility] Peer Support Lorella Nimrod [phone]). Offered to give patient information to [facility] Peer Support, patient denied the offered.

## 2015-01-26 NOTE — ED Notes (Signed)
BEHAVIORAL HEALTH ROUNDING Patient sleeping: Yes.   Patient alert and oriented: pt sleeping Behavior appropriate: Yes.  ; If no, describe: Nutrition and fluids offered: Yes  Toileting and hygiene offered: Yes  Sitter present: no Law enforcement present: Yes  

## 2015-01-26 NOTE — ED Notes (Signed)
Patient resting comfortably in room. No complaints or concerns voiced. No distress or abnormal behavior noted. Will continue to monitor with security cameras. Q 15 minute rounds continue. 

## 2015-01-26 NOTE — ED Provider Notes (Addendum)
-----------------------------------------   4:05 PM on 01/26/2015 -----------------------------------------  Patient has been seen by psychiatry. They believe the patient is safe for discharge home with [facility] follow-up. Patient is complaining of some right index finger pain. Patient does have an oblique fracture through the proximal phalanx of the next finger. I have seen and examined the patient personally. We have placed an aluminum splint, and the patient is to follow-up with orthopedics for further evaluation. Patient agreeable to plan.  Minna Antis, MD 01/26/15 1607  Minna Antis, MD 01/26/15 [phone]

## 2015-01-26 NOTE — ED Provider Notes (Signed)
-----------------------------------------   4:39 AM on 01/26/2015 -----------------------------------------   Blood pressure 153/101, pulse 69, temperature 98.2 F (36.8 C), temperature source Oral, resp. rate 18, height  (1.88 m), weight 230 lb (104.327 kg), SpO2 99 %.  The patient had no acute events since last update.  Calm and cooperative at this time.  Disposition is pending per Psychiatry/Behavioral Medicine team recommendations.  Dr. Toni Amend note says to stabilize and reassess tomorrow (today)     Loleta Rose, MD 01/26/15 [phone]
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
## 2016-09-24 ENCOUNTER — Emergency Department: Payer: BLUE CROSS/BLUE SHIELD

## 2016-09-24 ENCOUNTER — Emergency Department
Admission: EM | Admit: 2016-09-24 | Discharge: 2016-09-24 | Disposition: A | Discharge status: Home / Self Care | Payer: BLUE CROSS/BLUE SHIELD | Attending: Emergency Medicine | Admitting: Emergency Medicine

## 2016-09-24 ENCOUNTER — Encounter: Payer: Self-pay | Admitting: Emergency Medicine

## 2016-09-24 DIAGNOSIS — X58XXXA Exposure to other specified factors, initial encounter: Secondary | ICD-10-CM | POA: Insufficient documentation

## 2016-09-24 DIAGNOSIS — Y998 Other external cause status: Secondary | ICD-10-CM | POA: Diagnosis not present

## 2016-09-24 DIAGNOSIS — S62633A Displaced fracture of distal phalanx of left middle finger, initial encounter for closed fracture: Secondary | ICD-10-CM | POA: Diagnosis not present

## 2016-09-24 DIAGNOSIS — Z7982 Long term (current) use of aspirin: Secondary | ICD-10-CM | POA: Insufficient documentation

## 2016-09-24 DIAGNOSIS — I10 Essential (primary) hypertension: Secondary | ICD-10-CM | POA: Insufficient documentation

## 2016-09-24 DIAGNOSIS — S62631A Displaced fracture of distal phalanx of left index finger, initial encounter for closed fracture: Secondary | ICD-10-CM

## 2016-09-24 DIAGNOSIS — F17200 Nicotine dependence, unspecified, uncomplicated: Secondary | ICD-10-CM | POA: Diagnosis not present

## 2016-09-24 DIAGNOSIS — Z79899 Other long term (current) drug therapy: Secondary | ICD-10-CM | POA: Diagnosis not present

## 2016-09-24 DIAGNOSIS — Y929 Unspecified place or not applicable: Secondary | ICD-10-CM | POA: Insufficient documentation

## 2016-09-24 DIAGNOSIS — S6992XA Unspecified injury of left wrist, hand and finger(s), initial encounter: Secondary | ICD-10-CM | POA: Diagnosis present

## 2016-09-24 DIAGNOSIS — Y9367 Activity, basketball: Secondary | ICD-10-CM | POA: Diagnosis not present

## 2016-09-24 MED ORDER — NAPROXEN 500 MG PO TABS
500.0000 mg | ORAL_TABLET | Freq: Two times a day (BID) | ORAL | 0 refills | Status: DC
Start: 2016-09-24 — End: 2018-03-26

## 2016-09-24 MED ORDER — TRAMADOL HCL 50 MG PO TABS: 50.0000 mg | ORAL_TABLET | Freq: Four times a day (QID) | ORAL | 0 refills | Status: DC | PRN

## 2016-09-24 NOTE — ED Notes (Signed)
Injured finger splinted, patient instructed must wear at work.

## 2016-09-24 NOTE — Discharge Instructions (Signed)
Finger splint until evaluation by orthopedics.

## 2016-09-24 NOTE — ED Notes (Signed)
inst and rx and work note given.

## 2016-09-24 NOTE — ED Triage Notes (Signed)
Patient presents to the ED with painful middle finger on his left hand after playing basketball yesterday.  Patient is in no obvious distress at this time.
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
## 2017-11-18 ENCOUNTER — Ambulatory Visit (INDEPENDENT_AMBULATORY_CARE_PROVIDER_SITE_OTHER): Payer: BLUE CROSS/BLUE SHIELD | Admitting: Podiatry

## 2017-11-18 ENCOUNTER — Ambulatory Visit (INDEPENDENT_AMBULATORY_CARE_PROVIDER_SITE_OTHER): Payer: BLUE CROSS/BLUE SHIELD

## 2017-11-18 ENCOUNTER — Encounter: Payer: Self-pay | Admitting: Podiatry

## 2017-11-18 DIAGNOSIS — L409 Psoriasis, unspecified: Secondary | ICD-10-CM | POA: Diagnosis not present

## 2017-11-18 DIAGNOSIS — M19072 Primary osteoarthritis, left ankle and foot: Secondary | ICD-10-CM

## 2017-11-18 DIAGNOSIS — M19071 Primary osteoarthritis, right ankle and foot: Secondary | ICD-10-CM

## 2017-11-18 NOTE — Progress Notes (Signed)
His patient presents the office with chief complaint of painful joints on both feet.  He says these joints become achy and throbbing after walking significant amounts daily.  He also says they have started to swell after a days activity.  He says that they have been getting worse the last few months.  He denies any history of trauma or injury to the foot.  This patient is a type II diabetic with psoriasis.  He has provided no other self treatment besides ibuprofen and presents the office today for an evaluation and treatment.  General Appearance  Alert, conversant and in no acute stress.  Vascular  Dorsalis pedis and posterior tibial  pulses are palpable  bilaterally.  Capillary return is within normal limits  bilaterally. Temperature is within normal limits  bilaterally.  Neurologic  Senn-Weinstein monofilament wire test within normal limits  bilaterally. Muscle power within normal limits bilaterally.  Nails Thick disfigured discolored nails with subungual debris  from hallux to fifth toes bilaterally. No evidence of bacterial infection or drainage bilaterally.  Orthopedic  Dorsomedial exostosis of the first MPJ bilaterally.  Good range of motion first MPJ bilateral.  Crepitus is noted on the left first MPJ.  No evidence of any swelling noted today.  Skin  Noticeable dry plantar skin noted with evidence of blister like lesions on the medial and lateral aspect of both feet.  He states this is from his psoriasis.  Hallux Limitus 1st MPJ  B/L.    IE  Xrays were taken  B/l.  Joint space narrowing first MPJ.  Joint malalignment and subchondral sclerosis.  Deterioration of joint cartilage.  Dorsal lipping first metatarsal.  . Discussed conservative versus surgical treatment for this condition with this patient.  Patient was told to wear thick soled shoes and to be prescribed an anti-inflammatory.  He will bring in his list of medicatons, and Angie will then prescribe Mobic  as directed .  We will treat  him conservatively.  RTC .  I was wondering if he needs to have a topical cortisone cream for application to his feet   Helane GuntherGregory Ileane Sando DPM

## 2017-11-25 ENCOUNTER — Encounter: Payer: Self-pay | Admitting: Podiatry

## 2017-11-25 ENCOUNTER — Ambulatory Visit (INDEPENDENT_AMBULATORY_CARE_PROVIDER_SITE_OTHER): Payer: BLUE CROSS/BLUE SHIELD | Admitting: Podiatry

## 2017-11-25 DIAGNOSIS — M19071 Primary osteoarthritis, right ankle and foot: Secondary | ICD-10-CM | POA: Diagnosis not present

## 2017-11-25 DIAGNOSIS — M19072 Primary osteoarthritis, left ankle and foot: Secondary | ICD-10-CM | POA: Diagnosis not present

## 2017-11-25 NOTE — Addendum Note (Signed)
Addended by: Helane GuntherMAYER, Tyri Elmore on: 11/25/2017 01:32 PM   Modules accepted: Level of Service

## 2017-11-25 NOTE — Progress Notes (Signed)
Patient presents the office today to deliver the medication that he is taking daily.  He presents the office with an antalgic gait due to severely panful left forefoot.  He has been diagnosed with a hallux limitus/arthritis big toe joint, left foot.  He states that the pain. It has become more painful  since his visit last week .  He presents the office today for contreatment of his painful big toe joint, left foot  General Appearance  Alert, conversant and in no acute stress.  Vascular  Dorsalis pedis and posterior tibial  pulses are palpable  bilaterally.  Capillary return is within normal limits  bilaterally. Temperature is within normal limits  bilaterally.  Neurologic  Senn-Weinstein monofilament wire test within normal limits  bilaterally. Muscle power within normal limits bilaterally.  Nails Thick disfigured discolored nails with subungual debris  from hallux to fifth toes bilaterally. No evidence of bacterial infection or drainage bilaterally.  Orthopedic  No limitations of motion of motion feet .  No crepitus or effusions noted. Dorsal exostosis 1st MPJ B/L.  Marked pain on the dorsomedial aspect left foot.  No redness or swelling noted.    Skin  normotropic skin with no porokeratosis noted bilaterally.  No signs of infections or ulcers noted.    Hallux limitus 1st MPJ  B/L  ROV  Injection therapy 1st MPJ  Left foot.  Injection therapy using 1.0 cc. Of 2% xylocaine( 20 mg.) plus 1 cc. of kenalog-la ( 10 mg) plus 1/2 cc. of dexamethazone phosphate ( 2 mg).  Take Mobic bid daily.  RTC 2-3 weeks for follow up.   Helane GuntherGregory Anahis Furgeson DPM

## 2017-12-09 ENCOUNTER — Ambulatory Visit: Payer: BLUE CROSS/BLUE SHIELD | Admitting: Podiatry

## 2017-12-23 ENCOUNTER — Ambulatory Visit: Payer: BLUE CROSS/BLUE SHIELD | Admitting: Podiatry

## 2018-03-26 ENCOUNTER — Ambulatory Visit (INDEPENDENT_AMBULATORY_CARE_PROVIDER_SITE_OTHER): Payer: BLUE CROSS/BLUE SHIELD | Admitting: Podiatry

## 2018-03-26 ENCOUNTER — Encounter: Payer: Self-pay | Admitting: Podiatry

## 2018-03-26 DIAGNOSIS — M19072 Primary osteoarthritis, left ankle and foot: Secondary | ICD-10-CM

## 2018-03-26 NOTE — Progress Notes (Signed)
He presents today referred from Dr. Stacie Acres chief complaint of painful flareup of the first metatarsophalangeal joint left foot.  States is been him bothering him for the past 2 to 3 weeks injections in the past seem to work well but they have worn off.  Objective: Vital signs are stable alert and oriented x3.  Pulses are palpable.  He has severe pain on palpation of the first metatarsophalangeal joint with hallux limitus and pain on end range of motion.  Hallux valgus deformity is also noted.  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  Plan: After sterile Betadine skin prep I injected through a 30-gauge needle 10 mg of Kenalog and local anesthetic directly into the joint.  He tolerated procedure well we discussed the need for surgical intervention in the future I will follow-up with him on a as needed basis.

## 2018-12-11 ENCOUNTER — Other Ambulatory Visit: Payer: Self-pay

## 2018-12-11 ENCOUNTER — Emergency Department: Payer: BC Managed Care – PPO

## 2018-12-11 ENCOUNTER — Emergency Department
Admission: EM | Admit: 2018-12-11 | Discharge: 2018-12-11 | Disposition: A | Discharge status: Home / Self Care | Payer: BC Managed Care – PPO | Attending: Emergency Medicine | Admitting: Emergency Medicine

## 2018-12-11 ENCOUNTER — Encounter: Payer: Self-pay | Admitting: Emergency Medicine

## 2018-12-11 DIAGNOSIS — Z79899 Other long term (current) drug therapy: Secondary | ICD-10-CM | POA: Diagnosis not present

## 2018-12-11 DIAGNOSIS — Y92410 Unspecified street and highway as the place of occurrence of the external cause: Secondary | ICD-10-CM | POA: Diagnosis not present

## 2018-12-11 DIAGNOSIS — S161XXA Strain of muscle, fascia and tendon at neck level, initial encounter: Secondary | ICD-10-CM | POA: Diagnosis not present

## 2018-12-11 DIAGNOSIS — Z7984 Long term (current) use of oral hypoglycemic drugs: Secondary | ICD-10-CM | POA: Diagnosis not present

## 2018-12-11 DIAGNOSIS — S39012A Strain of muscle, fascia and tendon of lower back, initial encounter: Secondary | ICD-10-CM | POA: Diagnosis not present

## 2018-12-11 DIAGNOSIS — Z7982 Long term (current) use of aspirin: Secondary | ICD-10-CM | POA: Insufficient documentation

## 2018-12-11 DIAGNOSIS — F17200 Nicotine dependence, unspecified, uncomplicated: Secondary | ICD-10-CM | POA: Insufficient documentation

## 2018-12-11 DIAGNOSIS — I10 Essential (primary) hypertension: Secondary | ICD-10-CM | POA: Insufficient documentation

## 2018-12-11 DIAGNOSIS — S199XXA Unspecified injury of neck, initial encounter: Secondary | ICD-10-CM | POA: Diagnosis present

## 2018-12-11 DIAGNOSIS — Y9389 Activity, other specified: Secondary | ICD-10-CM | POA: Insufficient documentation

## 2018-12-11 DIAGNOSIS — Y998 Other external cause status: Secondary | ICD-10-CM | POA: Diagnosis not present

## 2018-12-11 MED ORDER — CYCLOBENZAPRINE HCL 5 MG PO TABS: 5.0000 mg | ORAL_TABLET | Freq: Three times a day (TID) | ORAL | 0 refills | Status: DC | PRN

## 2018-12-11 MED ORDER — NAPROXEN 500 MG PO TABS
500.0000 mg | ORAL_TABLET | Freq: Once | ORAL | Status: AC
  Administered 2018-12-11: 18:00:00 500 mg via ORAL
  Filled 2018-12-11: qty 1

## 2018-12-11 MED ORDER — NAPROXEN 500 MG PO TABS: 500.0000 mg | ORAL_TABLET | Freq: Two times a day (BID) | ORAL | 0 refills | Status: AC

## 2018-12-11 MED ORDER — CYCLOBENZAPRINE HCL 10 MG PO TABS
10.0000 mg | ORAL_TABLET | Freq: Once | ORAL | Status: AC
  Administered 2018-12-11: 18:00:00 10 mg via ORAL
  Filled 2018-12-11: qty 1
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
## 2019-05-19 ENCOUNTER — Ambulatory Visit
Admission: RE | Admit: 2019-05-19 | Discharge: 2019-05-19 | Disposition: A | Discharge status: Home / Self Care | Payer: BC Managed Care – PPO | Source: Ambulatory Visit | Attending: Otolaryngology | Admitting: Otolaryngology

## 2019-05-19 ENCOUNTER — Other Ambulatory Visit (HOSPITAL_COMMUNITY): Payer: Self-pay | Admitting: Otolaryngology

## 2019-05-19 ENCOUNTER — Other Ambulatory Visit: Payer: Self-pay

## 2019-05-19 ENCOUNTER — Other Ambulatory Visit: Payer: Self-pay | Admitting: Otolaryngology

## 2019-05-19 DIAGNOSIS — J341 Cyst and mucocele of nose and nasal sinus: Secondary | ICD-10-CM | POA: Insufficient documentation

## 2019-05-28 ENCOUNTER — Encounter: Payer: Self-pay | Admitting: Surgery

## 2019-05-28 ENCOUNTER — Ambulatory Visit (INDEPENDENT_AMBULATORY_CARE_PROVIDER_SITE_OTHER): Payer: BC Managed Care – PPO | Admitting: Surgery

## 2019-05-28 ENCOUNTER — Other Ambulatory Visit: Payer: Self-pay

## 2019-05-28 ENCOUNTER — Ambulatory Visit: Payer: Self-pay | Admitting: Surgery

## 2019-05-28 VITALS — BP 168/107 | HR 76 | Temp 97.9°F | Resp 12 | Ht 69.0 in | Wt 231.2 lb

## 2019-05-28 DIAGNOSIS — K429 Umbilical hernia without obstruction or gangrene: Secondary | ICD-10-CM | POA: Diagnosis not present
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
## 2019-05-28 NOTE — Patient Instructions (Addendum)
Our Surgery Scheduler will contact you within 24-48 hours to schedule your surgery. Please have the [facility] Sheet available when she calls you.   Umbilical Hernia, Adult  A hernia is a bulge of tissue that pushes through an opening between muscles. An umbilical hernia happens in the abdomen, near the belly button (umbilicus). The hernia may contain tissues from the small intestine, large intestine, or fatty tissue covering the intestines (omentum). Umbilical hernias in adults tend to get worse over time, and they require surgical treatment. There are several types of umbilical hernias. You may have:  A hernia located just above or below the umbilicus (indirect hernia). This is the most common type of umbilical hernia in adults.  A hernia that forms through an opening formed by the umbilicus (direct hernia).  A hernia that comes and goes (reducible hernia). A reducible hernia may be visible only when you strain, lift something heavy, or cough. This type of hernia can be pushed back into the abdomen (reduced).  A hernia that traps abdominal tissue inside the hernia (incarcerated hernia). This type of hernia cannot be reduced.  A hernia that cuts off blood flow to the tissues inside the hernia (strangulated hernia). The tissues can start to die if this happens. This type of hernia requires emergency treatment. What are the causes? An umbilical hernia happens when tissue inside the abdomen presses on a weak area of the abdominal muscles. What increases the risk? You may have a greater risk of this condition if you:  Are obese.  Have had several pregnancies.  Have a buildup of fluid inside your abdomen (ascites).  Have had surgery that weakens the abdominal muscles. What are the signs or symptoms? The main symptom of this condition is a painless bulge at or near the belly button. A reducible hernia may be visible only when you strain, lift something heavy, or cough. Other symptoms may  include:  Dull pain.  A feeling of pressure. Symptoms of a strangulated hernia may include:  Pain that gets increasingly worse.  Nausea and vomiting.  Pain when pressing on the hernia.  Skin over the hernia becoming red or purple.  Constipation.  Blood in the stool. How is this diagnosed? This condition may be diagnosed based on:  A physical exam. You may be asked to cough or strain while standing. These actions increase the pressure inside your abdomen and force the hernia through the opening in your muscles. Your health care provider may try to reduce the hernia by pressing on it.  Your symptoms and medical history. How is this treated? Surgery is the only treatment for an umbilical hernia. Surgery for a strangulated hernia is done as soon as possible. If you have a small hernia that is not incarcerated, you may need to lose weight before having surgery. Follow these instructions at home:  Lose weight, if told by your health care provider.  Do not try to push the hernia back in.  Watch your hernia for any changes in color or size. Tell your health care provider if any changes occur.  You may need to avoid activities that increase pressure on your hernia.  Do not lift anything that is heavier than 10 lb (4.5 kg) until your health care provider says that this is safe.  Take over-the-counter and prescription medicines only as told by your health care provider.  Keep all follow-up visits as told by your health care provider. This is important. Contact a health care provider if:  Your  hernia gets larger.  Your hernia becomes painful. Get help right away if:  You develop sudden, severe pain near the area of your hernia.  You have pain as well as nausea or vomiting.  You have pain and the skin over your hernia changes color.  You develop a fever. This information is not intended to replace advice given to you by your health care provider. Make sure you discuss any  questions you have with your health care provider. Document Revised: 06/19/2017 Document Reviewed: 11/05/2016 Elsevier Patient Education  [place].
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
## 2019-06-03 ENCOUNTER — Telehealth: Payer: Self-pay | Admitting: Surgery

## 2019-06-03 NOTE — Telephone Encounter (Signed)
Outbound call made to patient who was on his way home from work & will call back upon his arrival.  When he arrives, please advised him of the following pre admission date/time, Covid Testing date and Surgery date.  Surgery Date: 06/10/19 Preadmission Testing Date: 06/05/19 Covid Testing Date: 06/08/19 - patient [facility] ([address] [facility])  Patient has been made aware to call [phone], between 1-3:00pm the day before surgery, to find out what time to arrive.

## 2019-06-05 ENCOUNTER — Encounter
Admission: RE | Admit: 2019-06-05 | Discharge: 2019-06-05 | Disposition: A | Discharge status: Home / Self Care | Payer: BC Managed Care – PPO | Source: Ambulatory Visit | Attending: Surgery | Admitting: Surgery

## 2019-06-05 ENCOUNTER — Other Ambulatory Visit: Payer: Self-pay

## 2019-06-05 DIAGNOSIS — Z01818 Encounter for other preprocedural examination: Secondary | ICD-10-CM | POA: Diagnosis present

## 2019-06-05 HISTORY — DX: Unspecified osteoarthritis, unspecified site: M19.90

## 2019-06-05 HISTORY — DX: Type 2 diabetes mellitus without complications: E11.9

## 2019-06-05 NOTE — Patient Instructions (Addendum)
Your procedure is scheduled on: 06-10-19 [facility] Report to Same Day Surgery 2nd floor medical mall [facility] Entrance-take elevator on left to 2nd floor.  Check in with surgery information desk.) To find out your arrival time please call [phone] between 1PM - 3PM on 06-09-19 TUESDAY  Remember: Instructions that are not followed completely may result in serious medical risk, up to and including death, or upon the discretion of your surgeon and anesthesiologist your surgery may need to be rescheduled.    _x___ 1. Do not eat food after midnight the night before your procedure. NO GUM OR CANDY AFTER MIDNIGHT. You may drink WATER up to 2 hours before you are scheduled to arrive at the hospital for your procedure.  Do not drink WATER within 2 hours of your scheduled arrival to the hospital.  Type 1 and type 2 diabetics should only drink water.   ____Ensure clear carbohydrate drink on the way to the hospital for bariatric patients  ____Ensure clear carbohydrate drink 3 hours before surgery.    __x__ 2. No Alcohol for 24 hours before or after surgery.   __x__3. No Smoking or e-cigarettes for 24 prior to surgery.  Do not use any chewable tobacco products for at least 6 hour prior to surgery   ____  4. Bring all medications with you on the day of surgery if instructed.    __x__ 5. Notify your doctor if there is any change in your medical condition     (cold, fever, infections).    x___6. On the morning of surgery brush your teeth with toothpaste and water.  You may rinse your mouth with mouth wash if you wish.  Do not swallow any toothpaste or mouthwash.   Do not wear jewelry, make-up, hairpins, clips or nail polish.  Do not wear lotions, powders, or perfumes.   Do not shave 48 hours prior to surgery. Men may shave face and neck.  Do not bring valuables to the hospital.    [facility] is not responsible for any belongings or valuables.               Contacts, dentures or bridgework  may not be worn into surgery.  Leave your suitcase in the car. After surgery it may be brought to your room.  For patients admitted to the hospital, discharge time is determined by your treatment team.  _  Patients discharged the day of surgery will not be allowed to drive home.  You will need someone to drive you home and stay with you the night of your procedure.    Please read over the following fact sheets that you were given:   [facility] Preparing for Surgery  ____ Take anti-hypertensive listed below, cardiac, seizure, asthma, anti-reflux and psychiatric medicines. These include:  1. NONE  2.  3.  4.  5.  6.  ____Fleets enema or Magnesium Citrate as directed.   _x___ Use CHG Soap or sage wipes as directed on instruction sheet   ____ Use inhalers on the day of surgery and bring to hospital day of surgery  _X___ Stop Metformin 2 days prior to surgery-LAST DOSE ON SUNDAY (06-07-19)    ____ Take 1/2 of usual insulin dose the night before surgery and none on the morning surgery.   ____ Follow recommendations from Cardiologist, Pulmonologist or PCP regarding stopping Aspirin, Coumadin, Plavix ,Eliquis, Effient, or Pradaxa, and Pletal.  X____Stop Anti-inflammatories such as Advil, Aleve, Ibuprofen, Motrin, Naproxen, Naprosyn, Goodies powders or aspirin products  NOW-OK to take Tylenol    ____ Stop supplements until after surgery.    ____ Bring C-Pap to the hospital.

## 2019-06-08 ENCOUNTER — Other Ambulatory Visit: Payer: BC Managed Care – PPO

## 2019-06-08 ENCOUNTER — Other Ambulatory Visit: Payer: Self-pay

## 2019-06-08 ENCOUNTER — Encounter
Admission: RE | Admit: 2019-06-08 | Discharge: 2019-06-08 | Disposition: A | Discharge status: Home / Self Care | Payer: BC Managed Care – PPO | Source: Ambulatory Visit | Attending: Surgery | Admitting: Surgery

## 2019-06-08 DIAGNOSIS — R001 Bradycardia, unspecified: Secondary | ICD-10-CM | POA: Diagnosis not present

## 2019-06-08 DIAGNOSIS — E119 Type 2 diabetes mellitus without complications: Secondary | ICD-10-CM | POA: Insufficient documentation

## 2019-06-08 DIAGNOSIS — Z01818 Encounter for other preprocedural examination: Secondary | ICD-10-CM | POA: Diagnosis present

## 2019-06-08 DIAGNOSIS — I10 Essential (primary) hypertension: Secondary | ICD-10-CM | POA: Diagnosis not present

## 2019-06-08 DIAGNOSIS — Z20822 Contact with and (suspected) exposure to covid-19: Secondary | ICD-10-CM | POA: Insufficient documentation

## 2019-06-08 LAB — BASIC METABOLIC PANEL
Anion gap: 6 (ref 5–15)
BUN: 12 mg/dL (ref 6–20)
CO2: 27 mmol/L (ref 22–32)
Calcium: 9 mg/dL (ref 8.9–10.3)
Chloride: 107 mmol/L (ref 98–111)
Creatinine, Ser: 1.01 mg/dL (ref 0.61–1.24)
GFR calc Af Amer: >60 mL/min (ref >60)
GFR calc non Af Amer: >60 mL/min (ref >60)
Glucose, Bld: 87 mg/dL (ref 70–99)
Potassium: 4 mmol/L (ref 3.5–5.1)
Sodium: 140 mmol/L (ref 135–145)

## 2019-06-08 LAB — SARS CORONAVIRUS 2 (TAT 6-24 HRS): SARS Coronavirus 2: NEGATIVE

## 2019-06-15 ENCOUNTER — Other Ambulatory Visit: Payer: Self-pay

## 2019-06-15 ENCOUNTER — Other Ambulatory Visit
Admission: RE | Admit: 2019-06-15 | Discharge: 2019-06-15 | Disposition: A | Discharge status: Home / Self Care | Payer: BC Managed Care – PPO | Source: Ambulatory Visit | Attending: Surgery | Admitting: Surgery

## 2019-06-15 DIAGNOSIS — Z20822 Contact with and (suspected) exposure to covid-19: Secondary | ICD-10-CM | POA: Diagnosis not present

## 2019-06-15 DIAGNOSIS — Z01812 Encounter for preprocedural laboratory examination: Secondary | ICD-10-CM | POA: Insufficient documentation

## 2019-06-16 LAB — SARS CORONAVIRUS 2 (TAT 6-24 HRS): SARS Coronavirus 2: NEGATIVE

## 2019-06-16 MED ORDER — CEFAZOLIN SODIUM-DEXTROSE 2-4 GM/100ML-% IV SOLN
2.0000 g | INTRAVENOUS | Status: AC
  Administered 2019-06-17: 10:00:00 2 g via INTRAVENOUS

## 2019-06-17 ENCOUNTER — Other Ambulatory Visit: Payer: Self-pay

## 2019-06-17 ENCOUNTER — Ambulatory Visit: Payer: BC Managed Care – PPO | Admitting: Anesthesiology

## 2019-06-17 ENCOUNTER — Encounter: Payer: Self-pay | Admitting: Surgery

## 2019-06-17 ENCOUNTER — Ambulatory Visit
Admission: RE | Admit: 2019-06-17 | Discharge: 2019-06-17 | Disposition: A | Discharge status: Home / Self Care | Payer: BC Managed Care – PPO | Attending: Surgery | Admitting: Surgery

## 2019-06-17 ENCOUNTER — Encounter
Admission: RE | Disposition: A | Discharge status: Home / Self Care | Payer: Self-pay | Source: Home / Self Care | Attending: Surgery

## 2019-06-17 DIAGNOSIS — Z885 Allergy status to narcotic agent status: Secondary | ICD-10-CM | POA: Insufficient documentation

## 2019-06-17 DIAGNOSIS — F1414 Cocaine abuse with cocaine-induced mood disorder: Secondary | ICD-10-CM | POA: Diagnosis not present

## 2019-06-17 DIAGNOSIS — I10 Essential (primary) hypertension: Secondary | ICD-10-CM | POA: Insufficient documentation

## 2019-06-17 DIAGNOSIS — E559 Vitamin D deficiency, unspecified: Secondary | ICD-10-CM | POA: Insufficient documentation

## 2019-06-17 DIAGNOSIS — K429 Umbilical hernia without obstruction or gangrene: Secondary | ICD-10-CM | POA: Diagnosis present

## 2019-06-17 DIAGNOSIS — Z8249 Family history of ischemic heart disease and other diseases of the circulatory system: Secondary | ICD-10-CM | POA: Diagnosis not present

## 2019-06-17 DIAGNOSIS — F17200 Nicotine dependence, unspecified, uncomplicated: Secondary | ICD-10-CM | POA: Insufficient documentation

## 2019-06-17 DIAGNOSIS — Z79899 Other long term (current) drug therapy: Secondary | ICD-10-CM | POA: Diagnosis not present

## 2019-06-17 DIAGNOSIS — K420 Umbilical hernia with obstruction, without gangrene: Secondary | ICD-10-CM | POA: Insufficient documentation

## 2019-06-17 HISTORY — PX: XI ROBOTIC ASSISTED VENTRAL HERNIA: SHX6789

## 2019-06-17 LAB — URINE DRUG SCREEN, QUALITATIVE (ARMC ONLY)
Amphetamines, Ur Screen: NOT DETECTED
Barbiturates, Ur Screen: NOT DETECTED
Benzodiazepine, Ur Scrn: NOT DETECTED
Cannabinoid 50 Ng, Ur ~~LOC~~: POSITIVE — AB
Cocaine Metabolite,Ur ~~LOC~~: NOT DETECTED
MDMA (Ecstasy)Ur Screen: NOT DETECTED
Methadone Scn, Ur: NOT DETECTED
Opiate, Ur Screen: NOT DETECTED
Phencyclidine (PCP) Ur S: NOT DETECTED
Tricyclic, Ur Screen: NOT DETECTED

## 2019-06-17 LAB — GLUCOSE, CAPILLARY
Glucose-Capillary: 96 mg/dL (ref 70–99)
Glucose-Capillary: 128 mg/dL — ABNORMAL HIGH (ref 70–99)

## 2019-06-17 SURGERY — REPAIR, HERNIA, VENTRAL, ROBOT-ASSISTED
Anesthesia: General

## 2019-06-17 MED ORDER — MIDAZOLAM HCL 2 MG/2ML IJ SOLN
INTRAMUSCULAR | Status: DC | PRN
  Administered 2019-06-17: 2 mg via INTRAVENOUS

## 2019-06-17 MED ORDER — DEXAMETHASONE SODIUM PHOSPHATE 10 MG/ML IJ SOLN
INTRAMUSCULAR | Status: AC
  Filled 2019-06-17: qty 1

## 2019-06-17 MED ORDER — SODIUM CHLORIDE 0.9 % IV SOLN: INTRAVENOUS | Status: DC | PRN

## 2019-06-17 MED ORDER — PROPOFOL 10 MG/ML IV BOLUS
INTRAVENOUS | Status: DC | PRN
  Administered 2019-06-17: 150 mg via INTRAVENOUS

## 2019-06-17 MED ORDER — ROCURONIUM BROMIDE 100 MG/10ML IV SOLN
INTRAVENOUS | Status: DC | PRN
  Administered 2019-06-17: 50 mg via INTRAVENOUS
  Administered 2019-06-17: 20 mg via INTRAVENOUS

## 2019-06-17 MED ORDER — HYDROMORPHONE HCL 1 MG/ML IJ SOLN
0.5000 mg | INTRAMUSCULAR | Status: AC | PRN
  Administered 2019-06-17 (×2): 0.5 mg via INTRAVENOUS

## 2019-06-17 MED ORDER — LABETALOL HCL 5 MG/ML IV SOLN
INTRAVENOUS | Status: AC
  Filled 2019-06-17: qty 4

## 2019-06-17 MED ORDER — ACETAMINOPHEN 500 MG PO TABS: 1000.0000 mg | ORAL_TABLET | ORAL | Status: AC

## 2019-06-17 MED ORDER — HYDROMORPHONE HCL 1 MG/ML IJ SOLN
INTRAMUSCULAR | Status: AC
  Administered 2019-06-17: 0.5 mg via INTRAVENOUS
  Filled 2019-06-17: qty 1

## 2019-06-17 MED ORDER — ONDANSETRON HCL 4 MG/2ML IJ SOLN: 4.0000 mg | Freq: Once | INTRAMUSCULAR | Status: DC | PRN

## 2019-06-17 MED ORDER — FENTANYL CITRATE (PF) 100 MCG/2ML IJ SOLN
INTRAMUSCULAR | Status: AC
  Administered 2019-06-17: 12:00:00 25 ug via INTRAVENOUS
  Filled 2019-06-17: qty 2

## 2019-06-17 MED ORDER — FENTANYL CITRATE (PF) 100 MCG/2ML IJ SOLN: 25.0000 ug | INTRAMUSCULAR | Status: DC | PRN

## 2019-06-17 MED ORDER — HYDROMORPHONE HCL 1 MG/ML IJ SOLN
INTRAMUSCULAR | Status: AC
  Administered 2019-06-17: 13:00:00 0.5 mg via INTRAVENOUS
  Filled 2019-06-17: qty 1

## 2019-06-17 MED ORDER — GABAPENTIN 300 MG PO CAPS
ORAL_CAPSULE | ORAL | Status: AC
  Administered 2019-06-17: 09:00:00 300 mg via ORAL
  Filled 2019-06-17: qty 1

## 2019-06-17 MED ORDER — CELECOXIB 200 MG PO CAPS: 200.0000 mg | ORAL_CAPSULE | ORAL | Status: AC

## 2019-06-17 MED ORDER — PROPOFOL 10 MG/ML IV BOLUS
INTRAVENOUS | Status: AC
  Filled 2019-06-17: qty 20

## 2019-06-17 MED ORDER — BUPIVACAINE LIPOSOME 1.3 % IJ SUSP
INTRAMUSCULAR | Status: DC | PRN
  Administered 2019-06-17: 20 mL

## 2019-06-17 MED ORDER — LABETALOL HCL 5 MG/ML IV SOLN
INTRAVENOUS | Status: DC | PRN
  Administered 2019-06-17 (×2): 2.5 mg via INTRAVENOUS

## 2019-06-17 MED ORDER — DEXAMETHASONE SODIUM PHOSPHATE 10 MG/ML IJ SOLN
INTRAMUSCULAR | Status: DC | PRN
  Administered 2019-06-17: 10 mg via INTRAVENOUS

## 2019-06-17 MED ORDER — GABAPENTIN 300 MG PO CAPS: 300.0000 mg | ORAL_CAPSULE | ORAL | Status: AC

## 2019-06-17 MED ORDER — ONDANSETRON HCL 4 MG/2ML IJ SOLN
INTRAMUSCULAR | Status: DC | PRN
  Administered 2019-06-17: 4 mg via INTRAVENOUS

## 2019-06-17 MED ORDER — FAMOTIDINE 20 MG PO TABS
ORAL_TABLET | ORAL | Status: AC
  Administered 2019-06-17: 09:00:00 20 mg via ORAL
  Filled 2019-06-17: qty 1

## 2019-06-17 MED ORDER — CHLORHEXIDINE GLUCONATE CLOTH 2 % EX PADS
6.0000 | MEDICATED_PAD | Freq: Once | CUTANEOUS | Status: AC
  Administered 2019-06-17: 09:00:00 6 via TOPICAL

## 2019-06-17 MED ORDER — IBUPROFEN 800 MG PO TABS: 800.0000 mg | ORAL_TABLET | Freq: Three times a day (TID) | ORAL | 0 refills | Status: DC | PRN

## 2019-06-17 MED ORDER — FENTANYL CITRATE (PF) 100 MCG/2ML IJ SOLN
INTRAMUSCULAR | Status: DC | PRN
  Administered 2019-06-17 (×4): 50 ug via INTRAVENOUS

## 2019-06-17 MED ORDER — KETOROLAC TROMETHAMINE 30 MG/ML IJ SOLN
INTRAMUSCULAR | Status: AC
  Filled 2019-06-17: qty 1

## 2019-06-17 MED ORDER — FENTANYL CITRATE (PF) 100 MCG/2ML IJ SOLN
INTRAMUSCULAR | Status: AC
  Administered 2019-06-17: 13:00:00 25 ug via INTRAVENOUS
  Filled 2019-06-17: qty 2

## 2019-06-17 MED ORDER — ROCURONIUM BROMIDE 50 MG/5ML IV SOLN
INTRAVENOUS | Status: AC
  Filled 2019-06-17: qty 1

## 2019-06-17 MED ORDER — ACETAMINOPHEN 500 MG PO TABS
ORAL_TABLET | ORAL | Status: AC
  Administered 2019-06-17: 09:00:00 1000 mg via ORAL
  Filled 2019-06-17: qty 2

## 2019-06-17 MED ORDER — CEFAZOLIN SODIUM-DEXTROSE 2-4 GM/100ML-% IV SOLN
INTRAVENOUS | Status: AC
  Filled 2019-06-17: qty 100

## 2019-06-17 MED ORDER — FENTANYL CITRATE (PF) 100 MCG/2ML IJ SOLN
INTRAMUSCULAR | Status: AC
  Filled 2019-06-17: qty 2

## 2019-06-17 MED ORDER — KETOROLAC TROMETHAMINE 30 MG/ML IJ SOLN
30.0000 mg | Freq: Once | INTRAMUSCULAR | Status: AC
  Administered 2019-06-17: 30 mg via INTRAVENOUS

## 2019-06-17 MED ORDER — FAMOTIDINE 20 MG PO TABS: 20.0000 mg | ORAL_TABLET | Freq: Once | ORAL | Status: AC

## 2019-06-17 MED ORDER — SODIUM CHLORIDE 0.9 % IV SOLN: INTRAVENOUS | Status: DC

## 2019-06-17 MED ORDER — LIDOCAINE HCL (CARDIAC) PF 100 MG/5ML IV SOSY
PREFILLED_SYRINGE | INTRAVENOUS | Status: DC | PRN
  Administered 2019-06-17: 100 mg via INTRAVENOUS

## 2019-06-17 MED ORDER — SUGAMMADEX SODIUM 200 MG/2ML IV SOLN
INTRAVENOUS | Status: DC | PRN
  Administered 2019-06-17: 200 mg via INTRAVENOUS

## 2019-06-17 MED ORDER — BUPIVACAINE HCL (PF) 0.25 % IJ SOLN
INTRAMUSCULAR | Status: DC | PRN
  Administered 2019-06-17: 27 mL

## 2019-06-17 MED ORDER — MIDAZOLAM HCL 2 MG/2ML IJ SOLN
INTRAMUSCULAR | Status: AC
  Filled 2019-06-17: qty 2

## 2019-06-17 MED ORDER — HYDROCODONE-ACETAMINOPHEN 5-325 MG PO TABS: 1.0000 | ORAL_TABLET | Freq: Four times a day (QID) | ORAL | 0 refills | Status: DC | PRN

## 2019-06-17 MED ORDER — FENTANYL CITRATE (PF) 100 MCG/2ML IJ SOLN
25.0000 ug | INTRAMUSCULAR | Status: AC | PRN
  Administered 2019-06-17 (×6): 25 ug via INTRAVENOUS

## 2019-06-17 MED ORDER — BUPIVACAINE LIPOSOME 1.3 % IJ SUSP: 20.0000 mL | Freq: Once | INTRAMUSCULAR | Status: DC

## 2019-06-17 MED ORDER — CELECOXIB 200 MG PO CAPS
ORAL_CAPSULE | ORAL | Status: AC
  Administered 2019-06-17: 09:00:00 200 mg via ORAL
  Filled 2019-06-17: qty 1

## 2019-06-17 MED ORDER — ONDANSETRON HCL 4 MG/2ML IJ SOLN
INTRAMUSCULAR | Status: AC
  Filled 2019-06-17: qty 2

## 2019-06-17 SURGICAL SUPPLY — 46 items
ADH SKN CLS APL DERMABOND .7 (GAUZE/BANDAGES/DRESSINGS) ×1
APL PRP STRL LF DISP 70% ISPRP (MISCELLANEOUS) ×2
CANISTER SUCT 1200ML W/VALVE (MISCELLANEOUS) ×1 IMPLANT
CHLORAPREP W/TINT 26 (MISCELLANEOUS) ×5 IMPLANT
COVER TIP SHEARS 8 DVNC (MISCELLANEOUS) ×1 IMPLANT
COVER TIP SHEARS 8MM DA VINCI (MISCELLANEOUS) ×2
COVER WAND RF STERILE (DRAPES) ×3 IMPLANT
DECANTER SPIKE VIAL GLASS SM (MISCELLANEOUS) ×1 IMPLANT
DEFOGGER SCOPE WARMER CLEARIFY (MISCELLANEOUS) ×3 IMPLANT
DERMABOND ADVANCED (GAUZE/BANDAGES/DRESSINGS) ×2
DERMABOND ADVANCED .7 DNX12 (GAUZE/BANDAGES/DRESSINGS) ×1 IMPLANT
DEVICE SECURE STRAP 25 ABSORB (INSTRUMENTS) ×1 IMPLANT
DRAPE ARM DVNC X/XI (DISPOSABLE) ×4 IMPLANT
DRAPE COLUMN DVNC XI (DISPOSABLE) ×1 IMPLANT
DRAPE DA VINCI XI ARM (DISPOSABLE) ×6
DRAPE DA VINCI XI COLUMN (DISPOSABLE) ×2
ELECT REM PT RETURN 9FT ADLT (ELECTROSURGICAL) ×3
ELECTRODE REM PT RTRN 9FT ADLT (ELECTROSURGICAL) ×1 IMPLANT
GLOVE ORTHO TXT STRL SZ7.5 (GLOVE) ×9 IMPLANT
GOWN STRL REUS W/ TWL LRG LVL3 (GOWN DISPOSABLE) ×4 IMPLANT
GOWN STRL REUS W/TWL LRG LVL3 (GOWN DISPOSABLE) ×18
GRASPER SUT TROCAR 14GX15 (MISCELLANEOUS) ×2 IMPLANT
IRRIGATION STRYKERFLOW (MISCELLANEOUS) IMPLANT
IRRIGATOR STRYKERFLOW (MISCELLANEOUS)
IV NS 1000ML (IV SOLUTION)
IV NS 1000ML BAXH (IV SOLUTION) IMPLANT
KIT PINK PAD W/HEAD ARE REST (MISCELLANEOUS) ×3
KIT PINK PAD W/HEAD ARM REST (MISCELLANEOUS) ×1 IMPLANT
KIT TURNOVER KIT A (KITS) ×3 IMPLANT
MESH VENTRALEX ST 8CM LRG (Mesh General) ×2 IMPLANT
NEEDLE HYPO 22GX1.5 SAFETY (NEEDLE) ×3 IMPLANT
NS IRRIG 500ML POUR BTL (IV SOLUTION) ×3 IMPLANT
PACK LAP CHOLECYSTECTOMY (MISCELLANEOUS) ×3 IMPLANT
SCISSORS METZENBAUM CVD 33 (INSTRUMENTS) ×1 IMPLANT
SEAL CANN UNIV 5-8 DVNC XI (MISCELLANEOUS) ×3 IMPLANT
SEAL XI 5MM-8MM UNIVERSAL (MISCELLANEOUS) ×6
SUT MNCRL 4-0 (SUTURE) ×3
SUT MNCRL 4-0 27XMFL (SUTURE) ×1
SUT STRATAFIX 0 PDS+ CT-2 23 (SUTURE) ×3
SUT VICRYL 0 AB UR-6 (SUTURE) ×3 IMPLANT
SUT VLOC 90 S/L VL9 GS22 (SUTURE) ×2 IMPLANT
SUTURE MNCRL 4-0 27XMF (SUTURE) ×1 IMPLANT
SUTURE STRATFX 0 PDS+ CT-2 23 (SUTURE) ×1 IMPLANT
TROCAR XCEL 12X100 BLDLESS (ENDOMECHANICALS) ×1 IMPLANT
TROCAR Z-THREAD FIOS 11X100 BL (TROCAR) ×2 IMPLANT
TUBING INSUFFLATION (TUBING) ×3 IMPLANT

## 2019-06-17 NOTE — Anesthesia Procedure Notes (Signed)
Procedure Name: Intubation Date/Time: 06/17/2019 9:50 AM Performed by: Nolon Lennert, RN Pre-anesthesia Checklist: Patient identified, Patient being monitored, Timeout performed, Emergency Drugs available and Suction available Patient Re-evaluated:Patient Re-evaluated prior to induction Oxygen Delivery Method: Circle system utilized Preoxygenation: Pre-oxygenation with 100% oxygen Induction Type: IV induction Ventilation: Mask ventilation without difficulty and Oral airway inserted - appropriate to patient size Laryngoscope Size: Mac and 4 Grade View: Grade I Tube type: Oral Tube size: 7.5 mm Number of attempts: 1 Airway Equipment and Method: Stylet Placement Confirmation: ETT inserted through vocal cords under direct vision,  positive ETCO2 and breath sounds checked- equal and bilateral Secured at: 23 cm Tube secured with: Tape (lip) Dental Injury: Teeth and Oropharynx as per pre-operative assessment

## 2019-06-17 NOTE — Interval H&P Note (Signed)
History and Physical Interval Note:  06/17/2019 8:51 AM  Donald Oconnell  has presented today for surgery, with the diagnosis of umbilical hernia.  The various methods of treatment have been discussed with the patient and family. After consideration of risks, benefits and other options for treatment, the patient has consented to  Procedure(s): XI ROBOTIC ASSISTED VENTRAL HERNIA (N/A) as a surgical intervention.  The patient's history has been reviewed, patient examined, no change in status, stable for surgery.  I have reviewed the patient's chart and labs.  Questions were answered to the patient's satisfaction.     Campbell Lerner

## 2019-06-17 NOTE — OR Nursing (Signed)
Pt eating and drinking in post-op with NAD noted. PT denies any dizziness. Pt also voided prior to discharge

## 2019-06-17 NOTE — Op Note (Signed)
Robotic assisted laparoscopic ventral hernia Repair IPOM using round Ventralex BARD mesh   Pre-operative Diagnosis: Incarcerated ventral/umbilical hernia    Post-operative Diagnosis: same   Surgeon:  Campbell Lerner, M.D., FACS   Anesthesia: Gen. with endotracheal tube   Findings: incarcerated preperitoneal fat, 2.5-3 cm defect.   Estimated Blood Loss: 10 mL   Complications: none           Procedure Details  The patient was seen again in the Holding Room. The benefits, complications, treatment options, and expected outcomes were discussed with the patient. The risks of bleeding, infection, recurrence of symptoms, failure to resolve symptoms, bowel injury, mesh placement, mesh infection, any of which could require further surgery were reviewed with the patient. The likelihood of improving the patient's symptoms with return to their baseline status is good.  The patient and/or family concurred with the proposed plan, giving informed consent.  The patient was taken to Operating Room, identified and the procedure verified.  A Time Out was held and the above information confirmed.   Prior to the induction of general anesthesia, antibiotic prophylaxis was administered. VTE prophylaxis was in place. General endotracheal anesthesia was then administered and tolerated well. After the induction, the abdomen was prepped with Chloraprep and draped in the sterile fashion. The patient was positioned in the supine position.  Marcaine quarter percent with Exparel was used to inject all the incision sites. We used a left upper quadrant subcostal incision and using an optical FIOS 11 mm trocar, it was inserted with direct visualization and pneumoperitoneum obtained.  No hemodynamic compromise, no evidence of .   2 additional 8 mm ports were placed under direct visualization on the left lateral abdomen.  I visualized the hernia and there was an umbilical hernia measuring approximately 2.5 cm.  At this time I  went ahead and inserted the 8 cm mesh with the sutures.   The robot was brought to the surgical field and docked in the standard fashion.  We made sure that all instrumentation was kept under direct vision at all times and there was no collision between the arms.  I scrubbed out and went to the console. Falciform and adjacent preperitoneal adipose and umbilical ligaments and hernia sac were taken down with electrocautery to allow adequate mesh placement to be secured to the fascial tissue. Confirm and measured that the defect was 3 cm.  .  Using a 0 V-lock suture we closed the ventral defect primarily.  I used the same suture to secure the mesh and centered it over the fascial closure.    The mesh was secured circumferentially to the abdominal wall using 2-0 V-lock in the standard fashion.   The mesh appeared well secured against the  abdominal wall. A second look laparoscopy revealed no evidence of intra-abdominal injury.  I replaced the falciform over a portion the mesh with excess suture, to reduce risk of any internal defect.   All the needles were removed under direct visualization.  The instruments were removed and the robot was undocked.  The laparoscopic ports were removed under direct visualization and the pneumoperitoneum was deflated.   The fascia is approximated in the standard fashion, 0-Vicryl  utilizing the PMI under direct visualization. Incisions were closed with  4-0 Monocryl   Dermabond was used to coat the skin.  Patient tolerated procedure well and there were no immediate complications. Needle and laparotomy counts were correct    Campbell Lerner M.D., [facility] 06/17/2019 2:42 PM
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
## 2019-06-17 NOTE — Transfer of Care (Signed)
Immediate Anesthesia Transfer of Care Note  Patient: Donald Oconnell  Procedure(s) Performed: XI ROBOTIC ASSISTED VENTRAL HERNIA WITH MESH (N/A )  Patient Location: PACU  Anesthesia Type:General  Level of Consciousness: awake  Airway & Oxygen Therapy: Patient connected to face mask oxygen  Post-op Assessment: Post -op Vital signs reviewed and stable  Post vital signs: stable  Last Vitals:  Vitals Value Taken Time  BP 184/101   Temp    Pulse 66 06/17/19 1213  Resp    SpO2 100 % 06/17/19 1213  Vitals shown include unvalidated device data.  Last Pain:  Vitals:   06/17/19 0839  TempSrc: Temporal  PainSc: 0-No pain         Complications: No apparent anesthesia complications

## 2019-06-17 NOTE — Discharge Instructions (Signed)
Laparoscopic Ventral Hernia Repair, Care After This sheet gives you information about how to care for yourself after your procedure. Your health care provider may also give you more specific instructions. If you have problems or questions, contact your health care provider. What can I expect after the procedure? After the procedure, it is common to have:  Pain, discomfort, or soreness. Follow these instructions at home: Incision care   Follow instructions from your health care provider about how to take care of your incision. Make sure you: ? Wash your hands with soap and water before you change your bandage (dressing) or before you touch your abdomen. If soap and water are not available, use hand sanitizer. ? Change your dressing as told by your health care provider. ? Leave stitches (sutures), skin glue, or adhesive strips in place. These skin closures may need to stay in place for 2 weeks or longer. If adhesive strip edges start to loosen and curl up, you may trim the loose edges. Do not remove adhesive strips completely unless your health care provider tells you to do that.  Check your incision area every day for signs of infection. Check for: ? Redness, swelling, or pain. ? Fluid or blood. ? Warmth. ? Pus or a bad smell. Bathing   Do not take baths, swim, or use a hot tub until your health care provider approves. Ask your health care provider if you can take showers. You may only be allowed to take sponge baths for bathing.  Keep your bandage (dressing) dry until your health care provider says it can be removed. Activity  Do not lift anything that is heavier than 10 lb (4.5 kg) until your health care provider approves.  Do not drive or use heavy machinery while taking prescription pain medicine. Ask your health care provider when it is safe for you to drive or use heavy machinery.  Do not drive for 24 hours if you were given a medicine to help you relax (sedative) during your  procedure.  Rest as told by your health care provider. You may return to your normal activities when your health care provider approves. General instructions  Take over-the-counter and prescription medicines only as told by your health care provider.  To prevent or treat constipation while you are taking prescription pain medicine, your health care provider may recommend that you: ? Take over-the-counter or prescription medicines. ? Eat foods that are high in fiber, such as fresh fruits and vegetables, whole grains, and beans. ? Limit foods that are high in fat and processed sugars, such as fried and sweet foods.  Drink enough fluid to keep your urine clear or pale yellow.  Hold a pillow over your abdomen when you cough or sneeze. This helps with pain.  Keep all follow-up visits as told by your health care provider. This is important. Contact a health care provider if:  You have: ? A fever or chills. ? Redness, swelling, or pain around your incision. ? Fluid or blood coming from your incision. ? Pus or a bad smell coming from your incision. ? Pain that gets worse or does not get better with medicine. ? Nausea or vomiting. ? A cough. ? Shortness of breath.  Your incision feels warm to the touch.  You have not had a bowel movement in three days.  You are not able to urinate. Get help right away if:  You have severe pain in your abdomen.  You have persistent nausea and vomiting.  You have  redness, warmth, or pain in your leg.  You have chest pain.  You have trouble breathing. Summary  After this procedure, it is common to have pain, discomfort, or soreness.  Follow instructions from your health care provider about how to take care of your incision.  Check your incision area every day for signs of infection. Report any signs of infection to your health care provider.  Keep all follow-up visits as told by your health care provider. This is important. This information  is not intended to replace advice given to you by your health care provider. Make sure you discuss any questions you have with your health care provider. Document Revised: 04/19/2017 Document Reviewed: 12/28/2015 Elsevier Patient Education  2020 [employer]  AMBULATORY SURGERY  DISCHARGE INSTRUCTIONS   1) The drugs that you were given will stay in your system until tomorrow so for the next 24 hours you should not:  A) Drive an automobile B) Make any legal decisions C) Drink any alcoholic beverage   2) You may resume regular meals tomorrow.  Today it is better to start with liquids and gradually work up to solid foods.  You may eat anything you prefer, but it is better to start with liquids, then soup and crackers, and gradually work up to solid foods.   3) Please notify your doctor immediately if you have any unusual bleeding, trouble breathing, redness and pain at the surgery site, drainage, fever, or pain not relieved by medication.    4) Additional Instructions:        Please contact your physician with any problems or Same Day Surgery at [phone], Monday through Friday 6 am to 4 pm, or [place] at [facility] number at [phone].

## 2019-06-17 NOTE — OR Nursing (Signed)
Spoke with Dr. Henrene Hawking about pt BP prior to discharge. Pt instructed to take BP meds today since he did not take prior to surgery per MD and stable for discharge.

## 2019-06-18 ENCOUNTER — Telehealth: Payer: Self-pay | Admitting: *Deleted

## 2019-06-18 NOTE — Telephone Encounter (Signed)
Patient called and stated that he had surgery yesterday with Dr Claudine Mouton- ventral hernia repair and he wants to know when he can return to work. Please call and advise

## 2019-06-18 NOTE — Anesthesia Postprocedure Evaluation (Signed)
Anesthesia Post Note  Patient: Donald Oconnell  Procedure(s) Performed: XI ROBOTIC ASSISTED VENTRAL HERNIA WITH MESH (N/A )  Patient location during evaluation: PACU Anesthesia Type: General Level of consciousness: awake and alert and oriented Pain management: pain level controlled Vital Signs Assessment: post-procedure vital signs reviewed and stable Respiratory status: spontaneous breathing Cardiovascular status: blood pressure returned to baseline Anesthetic complications: no     Last Vitals:  Vitals:   06/17/19 1407 06/17/19 1432  BP: (!) 185/96 (!) 186/89  Pulse:  60  Resp:  18  Temp:    SpO2:  98%    Last Pain:  Vitals:   06/18/19 0821  TempSrc:   PainSc: 5                  Alani Lacivita

## 2019-06-18 NOTE — Telephone Encounter (Signed)
Patient instructed that he may be off of work for 1-2 weeks. He will have lifting restriction for 4-6 weeks following surgery. He will have paper work for his job dropped off to our office. He does not want to return to work until after seen on 07/02/19. Patient instructed on post operative care.

## 2019-06-22 ENCOUNTER — Telehealth: Payer: Self-pay | Admitting: *Deleted

## 2019-06-22 NOTE — Telephone Encounter (Signed)
Faxed FMLA to [phone]

## 2019-07-02 ENCOUNTER — Encounter: Payer: Self-pay | Admitting: Surgery

## 2019-07-02 ENCOUNTER — Other Ambulatory Visit: Payer: Self-pay

## 2019-07-02 ENCOUNTER — Ambulatory Visit (INDEPENDENT_AMBULATORY_CARE_PROVIDER_SITE_OTHER): Payer: Self-pay | Admitting: Surgery

## 2019-07-02 ENCOUNTER — Encounter: Payer: Self-pay | Admitting: Emergency Medicine

## 2019-07-02 VITALS — BP 183/115 | HR 68 | Temp 97.7°F | Resp 14 | Ht 69.0 in | Wt 238.8 lb

## 2019-07-02 DIAGNOSIS — Z8719 Personal history of other diseases of the digestive system: Secondary | ICD-10-CM | POA: Insufficient documentation

## 2019-07-02 DIAGNOSIS — Z9889 Other specified postprocedural states: Secondary | ICD-10-CM

## 2019-07-02 NOTE — Progress Notes (Signed)
[facility] POST-OP OFFICE VISIT  07/02/2019  HPI: Donald Oconnell is a 54 y.o. male 2 weeks s/p robotic umbilical hernia repair with mesh. Only complaint appears to be discomfort in region of repair when bending at waist. Otherwise he denies soreness, incisional pain, incisional drainage fevers or chills or nausea or vomiting. He reports his appetite is good and his bowel movements are normal.  Vital signs: BP (!) 183/115   Pulse 68   Temp 97.7 F (36.5 C) (Temporal)   Resp 14   Ht 5\' 9"  (1.753 m)   Wt 238 lb 12.8 oz (108.3 kg)   SpO2 98%   BMI 35.26 kg/m    Physical Exam: Constitutional: He appears well and appears mobile without discomfort. Abdomen: Umbilical area feels thickened consistent with underlying fascial repair and healing dermal fascial interface. Skin: Incisions are clean, dry and intact on the left flank. Dermabond intact. No mass-effect, no ecchymosis or hematoma.  Assessment/Plan: This is a 54 y.o. male 15 days s/p robotic umbilical hernia repair.  Patient Active Problem List   Diagnosis Date Noted  . Umbilical hernia without obstruction and without gangrene 05/28/2019  . Substance induced mood disorder (HCC) 01/25/2015  . Dysthymia 01/25/2015  . Cocaine abuse (HCC) 01/25/2015  . Hypertension 01/25/2015  . Psoriasis 01/25/2015  . Vitamin D deficiency 11/12/2013  . Open fracture of shaft of radius 09/09/2013  . Open fracture of shaft of ulna 09/09/2013  . Closed fracture of forearm 03/20/2013  . Fracture of radius and ulna 03/20/2013  . Open wound of upper arm 03/16/2013  . Lipoma 09/06/2003    -We discussed increasing his activity timing of his return to work and expectations for progress in his freedom of movement and the discomfort he feels in the umbilical region. We are anticipating progression with additional time. He appears quite pleased with his progress. We will see him in follow-up in a month or as needed. He may resume work in a  week and have a week of light duty to transition in.   09/08/2003 M.D., FACS 07/02/2019, 12:05 PM

## 2019-07-02 NOTE — Patient Instructions (Addendum)
We will see you in 42mo for a follow up. If you feel like you are doing okay and do not need to come in please give our office a call to cancel appointment.  Please call the office if you have any questions or concerns.  GENERAL POST-OPERATIVE PATIENT INSTRUCTIONS   WOUND CARE INSTRUCTIONS:  Keep a dry clean dressing on the wound if there is drainage. The initial bandage may be removed after 24 hours.  Once the wound has quit draining you may leave it open to air.  If clothing rubs against the wound or causes irritation and the wound is not draining you may cover it with a dry dressing during the daytime.  Try to keep the wound dry and avoid ointments on the wound unless directed to do so.  If the wound becomes bright red and painful or starts to drain infected material that is not clear, please contact your physician immediately.  If the wound is mildly pink and has a thick firm ridge underneath it, this is normal, and is referred to as a healing ridge.  This will resolve over the next 4-6 weeks.  BATHING: You may shower if you have been informed of this by your surgeon. However, Please do not submerge in a tub, hot tub, or pool until incisions are completely sealed or have been told by your surgeon that you may do so.  DIET:  You may eat any foods that you can tolerate.  It is a good idea to eat a high fiber diet and take in plenty of fluids to prevent constipation.  If you do become constipated you may want to take a mild laxative or take ducolax tablets on a daily basis until your bowel habits are regular.  Constipation can be very uncomfortable, along with straining, after recent surgery.  ACTIVITY:  You are encouraged to cough and deep breath or use your incentive spirometer if you were given one, every 15-30 minutes when awake.  This will help prevent respiratory complications and low grade fevers post-operatively if you had a general anesthetic.  You may want to hug a pillow when coughing and  sneezing to add additional support to the surgical area, if you had abdominal or chest surgery, which will decrease pain during these times.  You are encouraged to walk and engage in light activity for the next two weeks.  You should not lift more than 20 pounds, until 07/29/2019 as it could put you at increased risk for complications.  Twenty pounds is roughly equivalent to a plastic bag of groceries. At that time- Listen to your body when lifting, if you have pain when lifting, stop and then try again in a few days. Soreness after doing exercises or activities of daily living is normal as you get back in to your normal routine.  MEDICATIONS:  Try to take narcotic medications and anti-inflammatory medications, such as tylenol, ibuprofen, naprosyn, etc., with food.  This will minimize stomach upset from the medication.  Should you develop nausea and vomiting from the pain medication, or develop a rash, please discontinue the medication and contact your physician.  You should not drive, make important decisions, or operate machinery when taking narcotic pain medication.  SUNBLOCK Use sun block to incision area over the next year if this area will be exposed to sun. This helps decrease scarring and will allow you avoid a permanent darkened area over your incision.  QUESTIONS:  Please feel free to call our office if you  have any questions, and we will be glad to assist you.

## 2019-07-07 ENCOUNTER — Telehealth: Payer: Self-pay

## 2019-07-07 NOTE — Telephone Encounter (Signed)
Patient called and is scheduled to return to work on with restrictions until 07/15/19. He does not feel comfortable returning to work yet and would like to change his return to work date to the 24th of February. I have faxed a letter to his employer, [employer].  His new return to work date is 07/15/19 without restrictions.

## 2019-07-30 ENCOUNTER — Encounter: Payer: BC Managed Care – PPO | Admitting: Surgery

## 2019-08-06 ENCOUNTER — Other Ambulatory Visit: Payer: Self-pay

## 2019-08-06 ENCOUNTER — Encounter: Payer: Self-pay | Admitting: Otolaryngology

## 2019-08-11 ENCOUNTER — Other Ambulatory Visit: Payer: Self-pay

## 2019-08-11 ENCOUNTER — Other Ambulatory Visit
Admission: RE | Admit: 2019-08-11 | Discharge: 2019-08-11 | Disposition: A | Discharge status: Home / Self Care | Payer: BC Managed Care – PPO | Source: Ambulatory Visit | Attending: Otolaryngology | Admitting: Otolaryngology

## 2019-08-11 DIAGNOSIS — Z20822 Contact with and (suspected) exposure to covid-19: Secondary | ICD-10-CM | POA: Insufficient documentation

## 2019-08-11 DIAGNOSIS — Z01812 Encounter for preprocedural laboratory examination: Secondary | ICD-10-CM | POA: Diagnosis present

## 2019-08-11 LAB — SARS CORONAVIRUS 2 (TAT 6-24 HRS): SARS Coronavirus 2: NEGATIVE

## 2019-08-12 NOTE — Discharge Instructions (Signed)
General Anesthesia, Adult, Care After This sheet gives you information about how to care for yourself after your procedure. Your health care provider may also give you more specific instructions. If you have problems or questions, contact your health care provider. What can I expect after the procedure? After the procedure, the following side effects are common:  Pain or discomfort at the IV site.  Nausea.  Vomiting.  Sore throat.  Trouble concentrating.  Feeling cold or chills.  Weak or tired.  Sleepiness and fatigue.  Soreness and body aches. These side effects can affect parts of the body that were not involved in surgery. Follow these instructions at home:  For at least 24 hours after the procedure:  Have a responsible adult stay with you. It is important to have someone help care for you until you are awake and alert.  Rest as needed.  Do not: ? Participate in activities in which you could fall or become injured. ? Drive. ? Use heavy machinery. ? Drink alcohol. ? Take sleeping pills or medicines that cause drowsiness. ? Make important decisions or sign legal documents. ? Take care of children on your own. Eating and drinking  Follow any instructions from your health care provider about eating or drinking restrictions.  When you feel hungry, start by eating small amounts of foods that are soft and easy to digest (bland), such as toast. Gradually return to your regular diet.  Drink enough fluid to keep your urine pale yellow.  If you vomit, rehydrate by drinking water, juice, or clear broth. General instructions  If you have sleep apnea, surgery and certain medicines can increase your risk for breathing problems. Follow instructions from your health care provider about wearing your sleep device: ? Anytime you are sleeping, including during daytime naps. ? While taking prescription pain medicines, sleeping medicines, or medicines that make you drowsy.  Return to  your normal activities as told by your health care provider. Ask your health care provider what activities are safe for you.  Take over-the-counter and prescription medicines only as told by your health care provider.  If you smoke, do not smoke without supervision.  Keep all follow-up visits as told by your health care provider. This is important. Contact a health care provider if:  You have nausea or vomiting that does not get better with medicine.  You cannot eat or drink without vomiting.  You have pain that does not get better with medicine.  You are unable to pass urine.  You develop a skin rash.  You have a fever.  You have redness around your IV site that gets worse. Get help right away if:  You have difficulty breathing.  You have chest pain.  You have blood in your urine or stool, or you vomit blood. Summary  After the procedure, it is common to have a sore throat or nausea. It is also common to feel tired.  Have a responsible adult stay with you for the first 24 hours after general anesthesia. It is important to have someone help care for you until you are awake and alert.  When you feel hungry, start by eating small amounts of foods that are soft and easy to digest (bland), such as toast. Gradually return to your regular diet.  Drink enough fluid to keep your urine pale yellow.  Return to your normal activities as told by your health care provider. Ask your health care provider what activities are safe for you. This information is not   intended to replace advice given to you by your health care provider. Make sure you discuss any questions you have with your health care provider. Document Revised: 05/10/2017 Document Reviewed: 12/21/2016 Elsevier Patient Education  2020 [employer]  

## 2019-08-13 ENCOUNTER — Ambulatory Visit: Payer: BC Managed Care – PPO | Admitting: Anesthesiology

## 2019-08-13 ENCOUNTER — Other Ambulatory Visit: Payer: Self-pay

## 2019-08-13 ENCOUNTER — Encounter
Admission: RE | Disposition: A | Discharge status: Home / Self Care | Payer: Self-pay | Source: Ambulatory Visit | Attending: Otolaryngology

## 2019-08-13 ENCOUNTER — Encounter: Payer: Self-pay | Admitting: Otolaryngology

## 2019-08-13 ENCOUNTER — Ambulatory Visit
Admission: RE | Admit: 2019-08-13 | Discharge: 2019-08-13 | Disposition: A | Discharge status: Home / Self Care | Payer: BC Managed Care – PPO | Source: Ambulatory Visit | Attending: Otolaryngology | Admitting: Otolaryngology

## 2019-08-13 DIAGNOSIS — E118 Type 2 diabetes mellitus with unspecified complications: Secondary | ICD-10-CM | POA: Diagnosis not present

## 2019-08-13 DIAGNOSIS — F17200 Nicotine dependence, unspecified, uncomplicated: Secondary | ICD-10-CM | POA: Diagnosis not present

## 2019-08-13 DIAGNOSIS — K090 Developmental odontogenic cysts: Secondary | ICD-10-CM | POA: Diagnosis not present

## 2019-08-13 DIAGNOSIS — I10 Essential (primary) hypertension: Secondary | ICD-10-CM | POA: Diagnosis not present

## 2019-08-13 HISTORY — DX: Other specified disorders of nose and nasal sinuses: J34.89

## 2019-08-13 HISTORY — DX: Unspecified cirrhosis of liver: K74.60

## 2019-08-13 HISTORY — PX: EXCISION NASAL MASS: SHX6271

## 2019-08-13 LAB — GLUCOSE, CAPILLARY: Glucose-Capillary: 109 mg/dL — ABNORMAL HIGH (ref 70–99)

## 2019-08-13 SURGERY — EXCISION, MASS, NOSE
Anesthesia: General | Site: Nose | Laterality: Left

## 2019-08-13 MED ORDER — LIDOCAINE-EPINEPHRINE 1 %-1:100000 IJ SOLN
INTRAMUSCULAR | Status: DC | PRN
  Administered 2019-08-13: 2.5 mL

## 2019-08-13 MED ORDER — AMOXICILLIN 500 MG PO CAPS: 500.0000 mg | ORAL_CAPSULE | Freq: Three times a day (TID) | ORAL | 0 refills | Status: DC

## 2019-08-13 MED ORDER — DEXTROSE 5 % IV SOLN
2000.0000 mg | Freq: Once | INTRAVENOUS | Status: AC
  Administered 2019-08-13: 2 g via INTRAVENOUS

## 2019-08-13 MED ORDER — ONDANSETRON HCL 4 MG/2ML IJ SOLN: 4.0000 mg | Freq: Once | INTRAMUSCULAR | Status: DC | PRN

## 2019-08-13 MED ORDER — SUCCINYLCHOLINE CHLORIDE 20 MG/ML IJ SOLN
INTRAMUSCULAR | Status: DC | PRN
  Administered 2019-08-13: 100 mg via INTRAVENOUS

## 2019-08-13 MED ORDER — PROPOFOL 10 MG/ML IV BOLUS
INTRAVENOUS | Status: DC | PRN
  Administered 2019-08-13: 160 mg via INTRAVENOUS

## 2019-08-13 MED ORDER — LIDOCAINE HCL (CARDIAC) PF 100 MG/5ML IV SOSY
PREFILLED_SYRINGE | INTRAVENOUS | Status: DC | PRN
  Administered 2019-08-13: 40 mg via INTRAVENOUS

## 2019-08-13 MED ORDER — LABETALOL HCL 5 MG/ML IV SOLN
INTRAVENOUS | Status: DC | PRN
  Administered 2019-08-13 (×3): 5 mg via INTRAVENOUS

## 2019-08-13 MED ORDER — MIDAZOLAM HCL 5 MG/5ML IJ SOLN
INTRAMUSCULAR | Status: DC | PRN
  Administered 2019-08-13: 2 mg via INTRAVENOUS

## 2019-08-13 MED ORDER — TRAMADOL HCL 50 MG PO TABS: 50.0000 mg | ORAL_TABLET | Freq: Four times a day (QID) | ORAL | 0 refills | Status: AC | PRN

## 2019-08-13 MED ORDER — FENTANYL CITRATE (PF) 100 MCG/2ML IJ SOLN
25.0000 ug | INTRAMUSCULAR | Status: DC | PRN
  Administered 2019-08-13: 50 ug via INTRAVENOUS
  Administered 2019-08-13: 25 ug via INTRAVENOUS

## 2019-08-13 MED ORDER — GLYCOPYRROLATE 0.2 MG/ML IJ SOLN
INTRAMUSCULAR | Status: DC | PRN
  Administered 2019-08-13: .1 mg via INTRAVENOUS

## 2019-08-13 MED ORDER — ACETAMINOPHEN 500 MG PO TABS
1000.0000 mg | ORAL_TABLET | Freq: Once | ORAL | Status: AC | PRN
  Administered 2019-08-13: 1000 mg via ORAL

## 2019-08-13 MED ORDER — ONDANSETRON HCL 4 MG/2ML IJ SOLN
INTRAMUSCULAR | Status: DC | PRN
  Administered 2019-08-13: 4 mg via INTRAVENOUS

## 2019-08-13 MED ORDER — DEXAMETHASONE SODIUM PHOSPHATE 4 MG/ML IJ SOLN
INTRAMUSCULAR | Status: DC | PRN
  Administered 2019-08-13: 10 mg via INTRAVENOUS

## 2019-08-13 MED ORDER — FENTANYL CITRATE (PF) 100 MCG/2ML IJ SOLN
INTRAMUSCULAR | Status: DC | PRN
  Administered 2019-08-13 (×2): 25 ug via INTRAVENOUS
  Administered 2019-08-13 (×3): 50 ug via INTRAVENOUS

## 2019-08-13 MED ORDER — OXYMETAZOLINE HCL 0.05 % NA SOLN
2.0000 | Freq: Once | NASAL | Status: AC
  Administered 2019-08-13: 2 via NASAL

## 2019-08-13 MED ORDER — PHENYLEPHRINE HCL (PRESSORS) 10 MG/ML IV SOLN
INTRAVENOUS | Status: DC | PRN
  Administered 2019-08-13: 100 ug via INTRAVENOUS
  Administered 2019-08-13: 50 ug via INTRAVENOUS
  Administered 2019-08-13 (×6): 100 ug via INTRAVENOUS

## 2019-08-13 MED ORDER — LACTATED RINGERS IV SOLN
10.0000 mL/h | INTRAVENOUS | Status: DC
  Administered 2019-08-13: 10 mL/h via INTRAVENOUS

## 2019-08-13 SURGICAL SUPPLY — 26 items
BLADE SURG 15 STRL LF DISP TIS (BLADE) IMPLANT
BLADE SURG 15 STRL SS (BLADE) ×3
CORD BIP STRL DISP 12FT (MISCELLANEOUS) ×2 IMPLANT
DRSG TELFA 4X3 1S NADH ST (GAUZE/BANDAGES/DRESSINGS) ×2 IMPLANT
ELECT REM PT RETURN 9FT ADLT (ELECTROSURGICAL) ×3
ELECTRODE REM PT RTRN 9FT ADLT (ELECTROSURGICAL) ×1 IMPLANT
GAUZE SPONGE 4X4 12PLY STRL (GAUZE/BANDAGES/DRESSINGS) ×2 IMPLANT
GLOVE PI ULTRA LF STRL 7.5 (GLOVE) ×2 IMPLANT
GLOVE PI ULTRA NON LATEX 7.5 (GLOVE) ×4
GOWN STRL REUS W/ TWL LRG LVL3 (GOWN DISPOSABLE) IMPLANT
GOWN STRL REUS W/TWL LRG LVL3 (GOWN DISPOSABLE) ×3
KIT TURNOVER KIT A (KITS) ×3 IMPLANT
NDL HYPO 25GX1X1/2 BEV (NEEDLE) ×1 IMPLANT
NEEDLE HYPO 25GX1X1/2 BEV (NEEDLE) ×3 IMPLANT
NS IRRIG 500ML POUR BTL (IV SOLUTION) ×3 IMPLANT
PACK ENT CUSTOM (PACKS) ×3 IMPLANT
PENCIL SMOKE EVACUATOR (MISCELLANEOUS) ×3 IMPLANT
SOL PREP PVP 2OZ (MISCELLANEOUS) ×3
SOLUTION PREP PVP 2OZ (MISCELLANEOUS) ×1 IMPLANT
SPONGE KITTNER 5P (MISCELLANEOUS) ×3 IMPLANT
STRAP BODY AND KNEE 60X3 (MISCELLANEOUS) ×3 IMPLANT
SUCTION FRAZIER HANDLE 10FR (MISCELLANEOUS) ×3
SUCTION TUBE FRAZIER 10FR DISP (MISCELLANEOUS) IMPLANT
SUT CHROMIC 3 0 SH 27 (SUTURE) ×2 IMPLANT
SUT VIC AB 5-0 PC1 18 (SUTURE) ×2 IMPLANT
SYR 10ML LL (SYRINGE) ×3 IMPLANT

## 2019-08-13 NOTE — Op Note (Signed)
08/13/2019  8:52 AM    Wendelyn Breslow  [id number]   Pre-Op Dx: Cyst in the left upper lip protruding into the left  floor of the nose causing some airway obstruction.  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  Surg:  Beverly Sessions Cleona Doubleday  Anes:  GOT  EBL: 10 mL  Comp: None  Findings: The cyst was attached to the maxilla above the gumline.  There was a defect in the cortex of the maxilla where it was adherent to the soft tissues just above the left upper medial incisor.  The cyst was adherent to the soft tissues and muscle of the lip and was stuck to the skin of the floor the nose.  Some of the skin of the floor the nose was removed with the cyst.  This left a defect of the skin and the floor the nose that had to be repaired.  Procedure: The patient was brought to the operating room and placed in the supine position.  He was given general anesthesia by oral endotracheal intubation.  Tube was brought out the right side of the mouth.  Once patient was asleep the upper lip was lifted up and about 2 mL of 1% Xylocaine with epi 1: 100,000 was used for infiltration into the upper lip mucosa.  About another 1/2 mL was placed under the skin in the anterior floor of the nose where you could see this mass protruding upward.  It was pushing out the left upper lip in front of the nose as well.  The cyst was approximately 2 cm in diameter.  His face was prepped and draped sterile fashion.  [employer] retractors were used to hold the left upper lip upward and incision was made in the upper lip mucosa above the gumline.  This was carried down to the periosteum of the maxilla and a freer elevator was used to elevate this up to the level of the mass.  There was a defect of the cortex here and a freer elevator was used to free up the cyst on both sides of this defect and then slowly separated from the  soft tissue of the maxilla bone.  This was directly above his left upper incisor.  The defect in the cortex was approximately 6 or 7 mm in diameter.  The bone above it had an intact cortex up to the orbital rim.  There was muscle that was densely adherent to the outside of the cyst and this was freed up through blunt and sharp dissection.  The inferior portion of the cyst was freed and then the lateral border was freed up.  The medial border had scar tissue up to the midline and slightly across the midline of the lip.  The remaining attachments were then at the anterior floor of the nose where the skin was being pulled down as I retracted on the cyst.  This was so adherent I could not find a plane to separate the 2 so a small circle of skin was removed from the anterior floor nose that was overlying the cyst.  There is a small amount of leakage from the cyst during the procedure this was all suctioned away.  Once the entire cyst was removed then the wound was copiously irrigated.  There is no significant bleeding noted.  Freer elevator was used to free up some of the soft tissue  in the matrix of the maxilla where the cortex was missing.  There is no significant bleeding or signs of infection coming from here.  A 5-0 Vicryl was then used to close the skin hole from inside.  This was done from under the lip to pull the skin tissues together in close the defect so there is no opening from the nose into the upper lip.  Once this was closed the lip and muscle was then laid back in its normal position.  A 3-0 chromic was used to take very thick sutures of the mucosa just above the gumline and suturing it to the muscle and mucosa of the upper wound edge.  A running locking suture of 3-0 chromic was used to close the incision in the mucosa of the upper lip.  There is no significant bleeding at all.  The nostril was visualized and the airway was now more open than before.  There is small buckling of tissue on the floor of  the nose where the skin tissue had been brought together to close the hole.  The skin was lying on the floor and no packing was deemed necessary for the nose.  The patient was awakened and taken to the recovery room in satisfactory condition.  There were no operative complications.  Dispo:   To PACU to be discharged home  Plan: To follow-up in the office in 1 week to evaluate the wound.  I have given him tramadol to use for pain since he had some challenges with oxycodone previously.  He can supplement this with Tylenol or ibuprofen.  We will start him on amoxicillin 500 mg 3 times daily for 10 days as well because this could have been from a bad left upper incisor as the source of this.  Elon Alas Posey Petrik  08/13/2019 8:52 AM

## 2019-08-13 NOTE — Anesthesia Postprocedure Evaluation (Signed)
Anesthesia Post Note  Patient: Donald Oconnell  Procedure(s) Performed: EXCISION NASAL MASS (Left Nose)     Patient location during evaluation: PACU Anesthesia Type: General Level of consciousness: awake and alert and oriented Pain management: satisfactory to patient Vital Signs Assessment: post-procedure vital signs reviewed and stable Respiratory status: spontaneous breathing, nonlabored ventilation and respiratory function stable Cardiovascular status: blood pressure returned to baseline and stable Postop Assessment: Adequate PO intake and No signs of nausea or vomiting Anesthetic complications: no    Cherly Beach

## 2019-08-13 NOTE — Anesthesia Preprocedure Evaluation (Signed)
Anesthesia Evaluation  Patient identified by MRN, date of birth, ID band Patient awake    Reviewed: Allergy & Precautions, H&P , NPO status , Patient's Chart, lab work & pertinent test results  Airway Mallampati: II  TM Distance: >3 FB Neck ROM: full    Dental no notable dental hx.    Pulmonary Current Smoker and Patient abstained from smoking.,    Pulmonary exam normal breath sounds clear to auscultation       Cardiovascular hypertension, Normal cardiovascular exam Rhythm:regular Rate:Normal     Neuro/Psych PSYCHIATRIC DISORDERS    GI/Hepatic (+)     substance abuse  cocaine use and marijuana use,   Endo/Other  diabetes, Type 2  Renal/GU      Musculoskeletal   Abdominal   Peds  Hematology   Anesthesia Other Findings   Reproductive/Obstetrics                             Anesthesia Physical Anesthesia Plan  ASA: II  Anesthesia Plan: General LMA   Post-op Pain Management:    Induction:   PONV Risk Score and Plan: 2 and Treatment may vary due to age or medical condition, Ondansetron and Dexamethasone  Airway Management Planned:   Additional Equipment:   Intra-op Plan:   Post-operative Plan:   Informed Consent: I have reviewed the patients History and Physical, chart, labs and discussed the procedure including the risks, benefits and alternatives for the proposed anesthesia with the patient or authorized representative who has indicated his/her understanding and acceptance.     Dental Advisory Given  Plan Discussed with: CRNA  Anesthesia Plan Comments: (Pt is vague about recent cocaine use, and cannot confirm he has had no exposure as recently as yesterday.  With current HTN, will check EKG for long QT prior to proceeding.)        Anesthesia Quick Evaluation

## 2019-08-13 NOTE — Transfer of Care (Signed)
Immediate Anesthesia Transfer of Care Note  Patient: Donald Oconnell  Procedure(s) Performed: EXCISION NASAL MASS (Left Nose)  Patient Location: PACU  Anesthesia Type: General LMA  Level of Consciousness: awake, alert  and patient cooperative  Airway and Oxygen Therapy: Patient Spontanous Breathing and Patient connected to supplemental oxygen  Post-op Assessment: Post-op Vital signs reviewed, Patient's Cardiovascular Status Stable, Respiratory Function Stable, Patent Airway and No signs of Nausea or vomiting  Post-op Vital Signs: Reviewed and stable  Complications: No apparent anesthesia complications

## 2019-08-13 NOTE — Anesthesia Procedure Notes (Signed)
Procedure Name: Intubation Date/Time: 08/13/2019 7:46 AM Performed by: Jimmy Picket, CRNA Pre-anesthesia Checklist: Patient identified, Emergency Drugs available, Suction available, Patient being monitored and Timeout performed Patient Re-evaluated:Patient Re-evaluated prior to induction Oxygen Delivery Method: Circle system utilized Preoxygenation: Pre-oxygenation with 100% oxygen Induction Type: IV induction Ventilation: Mask ventilation without difficulty Laryngoscope Size: Miller and 3 Grade View: Grade II Tube type: Oral Rae Tube size: 7.5 mm Number of attempts: 1 Placement Confirmation: ETT inserted through vocal cords under direct vision,  positive ETCO2 and breath sounds checked- equal and bilateral Tube secured with: Tape Dental Injury: Teeth and Oropharynx as per pre-operative assessment

## 2019-08-13 NOTE — H&P (Signed)
H&P has been reviewed and patient reevaluated, no changes necessary. To be downloaded later.  

## 2019-08-14 LAB — SURGICAL PATHOLOGY

## 2019-08-29 ENCOUNTER — Ambulatory Visit: Payer: BC Managed Care – PPO | Attending: Oncology

## 2019-08-29 DIAGNOSIS — Z23 Encounter for immunization: Secondary | ICD-10-CM

## 2019-08-29 NOTE — Progress Notes (Signed)
   Covid-19 Vaccination Clinic  Name:  [name]    MRN: [id number] DOB: 1965/09/13  08/29/2019  [name] was observed post Covid-19 immunization for 15 minutes without incident. He was provided with Vaccine Information Sheet and instruction to access the V-Safe system.   [name] was instructed to call 911 with any severe reactions post vaccine: [name] Kitchen Difficulty breathing  . Swelling of face and throat  . A fast heartbeat  . A bad rash all over body  . Dizziness and weakness   Immunizations Administered    Name Date Dose VIS Date Route   Pfizer COVID-19 Vaccine 08/29/2019 10:26 AM 0.3 mL 05/01/2019 Intramuscular   Manufacturer: [employer], [employer]   Lot: [id number]   NDC: [address]-1

## 2019-09-29 ENCOUNTER — Ambulatory Visit: Payer: BC Managed Care – PPO | Attending: Internal Medicine

## 2019-09-29 DIAGNOSIS — Z23 Encounter for immunization: Secondary | ICD-10-CM

## 2019-09-29 NOTE — Progress Notes (Signed)
   Covid-19 Vaccination Clinic  Name:  Donald Oconnell    MRN: [id number] DOB: Sep 05, 1965  09/29/2019  [name] was observed post Covid-19 immunization for 15 minutes without incident. He was provided with Vaccine Information Sheet and instruction to access the V-Safe system.   [name] was instructed to call 911 with any severe reactions post vaccine: [name] Kitchen Difficulty breathing  . Swelling of face and throat  . A fast heartbeat  . A bad rash all over body  . Dizziness and weakness   Immunizations Administered    Name Date Dose VIS Date Route   Pfizer COVID-19 Vaccine 09/29/2019  4:52 PM 0.3 mL 07/15/2018 Intramuscular   Manufacturer: [employer], [employer]   Lot: [id number]   NDC: [address]-3

## 2020-05-16 ENCOUNTER — Encounter: Payer: Self-pay | Admitting: Emergency Medicine

## 2020-05-16 ENCOUNTER — Emergency Department: Payer: BC Managed Care – PPO

## 2020-05-16 ENCOUNTER — Emergency Department
Admission: EM | Admit: 2020-05-16 | Discharge: 2020-05-16 | Disposition: A | Discharge status: Home / Self Care | Payer: BC Managed Care – PPO | Attending: Emergency Medicine | Admitting: Emergency Medicine

## 2020-05-16 ENCOUNTER — Other Ambulatory Visit: Payer: Self-pay

## 2020-05-16 DIAGNOSIS — Z7984 Long term (current) use of oral hypoglycemic drugs: Secondary | ICD-10-CM | POA: Diagnosis not present

## 2020-05-16 DIAGNOSIS — F17210 Nicotine dependence, cigarettes, uncomplicated: Secondary | ICD-10-CM | POA: Diagnosis not present

## 2020-05-16 DIAGNOSIS — R519 Headache, unspecified: Secondary | ICD-10-CM | POA: Diagnosis present

## 2020-05-16 DIAGNOSIS — I10 Essential (primary) hypertension: Secondary | ICD-10-CM | POA: Diagnosis not present

## 2020-05-16 DIAGNOSIS — Z79899 Other long term (current) drug therapy: Secondary | ICD-10-CM | POA: Insufficient documentation

## 2020-05-16 DIAGNOSIS — E119 Type 2 diabetes mellitus without complications: Secondary | ICD-10-CM | POA: Insufficient documentation

## 2020-05-16 DIAGNOSIS — I6200 Nontraumatic subdural hemorrhage, unspecified: Secondary | ICD-10-CM | POA: Insufficient documentation

## 2020-05-16 DIAGNOSIS — R110 Nausea: Secondary | ICD-10-CM | POA: Diagnosis not present

## 2020-05-16 DIAGNOSIS — G9608 Other cranial cerebrospinal fluid leak: Secondary | ICD-10-CM

## 2020-05-16 MED ORDER — PROCHLORPERAZINE EDISYLATE 10 MG/2ML IJ SOLN
10.0000 mg | Freq: Once | INTRAMUSCULAR | Status: AC
  Administered 2020-05-16: 10 mg via INTRAVENOUS
  Filled 2020-05-16: qty 2

## 2020-05-16 MED ORDER — LOSARTAN POTASSIUM 50 MG PO TABS
100.0000 mg | ORAL_TABLET | Freq: Once | ORAL | Status: AC
  Administered 2020-05-16: 100 mg via ORAL
  Filled 2020-05-16: qty 2

## 2020-05-16 MED ORDER — DIPHENHYDRAMINE HCL 50 MG/ML IJ SOLN
25.0000 mg | Freq: Once | INTRAMUSCULAR | Status: AC
  Administered 2020-05-16: 25 mg via INTRAVENOUS
  Filled 2020-05-16: qty 1

## 2020-05-16 MED ORDER — KETOROLAC TROMETHAMINE 30 MG/ML IJ SOLN
15.0000 mg | Freq: Once | INTRAMUSCULAR | Status: AC
  Administered 2020-05-16: 15 mg via INTRAVENOUS
  Filled 2020-05-16: qty 1

## 2020-05-16 MED ORDER — HYDROCHLOROTHIAZIDE 25 MG PO TABS
25.0000 mg | ORAL_TABLET | Freq: Every day | ORAL | Status: DC
  Administered 2020-05-16: 25 mg via ORAL
  Filled 2020-05-16: qty 1

## 2020-05-16 MED ORDER — LACTATED RINGERS IV BOLUS
1000.0000 mL | Freq: Once | INTRAVENOUS | Status: AC
  Administered 2020-05-16: 1000 mL via INTRAVENOUS

## 2020-05-16 MED ORDER — HYDROCODONE-ACETAMINOPHEN 5-325 MG PO TABS: 1.0000 | ORAL_TABLET | ORAL | 0 refills | Status: DC | PRN

## 2020-05-16 NOTE — ED Triage Notes (Signed)
C/O right sided headache x 2 weeks.  AAOx3. Skin warm and dry.  MAE equally and strong.  NAD.

## 2020-05-16 NOTE — ED Notes (Signed)
Provider Larinda Buttery notified pt stated meds already given have made him feel "edgy" and "haven't helped". Pt requesting to try something different.

## 2020-05-16 NOTE — ED Notes (Signed)
See triage note. Pt reports L-sided HA for 2 weeks. No relief with OTC meds. Reports changes in vision, dizziness, light sensitivity, sound sensitivity. Denies history of strokes within family. Denies CP but reports history of issues with heart in family. Denies history of migraines or allergies. Admits history of HTN. States compliant with BP meds.

## 2020-05-16 NOTE — ED Notes (Signed)
Pt alert and sitting calmly in chair at bedside. Skin dry. Resp reg/unlabored. Denies fever. Provider Jessup at bedside.
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
## 2020-05-16 NOTE — ED Notes (Signed)
Patient transported to CT 

## 2020-05-20 ENCOUNTER — Emergency Department
Admission: EM | Admit: 2020-05-20 | Discharge: 2020-05-20 | Disposition: A | Discharge status: Home / Self Care | Payer: BC Managed Care – PPO | Attending: Student in an Organized Health Care Education/Training Program | Admitting: Student in an Organized Health Care Education/Training Program

## 2020-05-20 ENCOUNTER — Emergency Department: Payer: BC Managed Care – PPO

## 2020-05-20 ENCOUNTER — Other Ambulatory Visit: Payer: Self-pay

## 2020-05-20 DIAGNOSIS — E119 Type 2 diabetes mellitus without complications: Secondary | ICD-10-CM | POA: Insufficient documentation

## 2020-05-20 DIAGNOSIS — Z79899 Other long term (current) drug therapy: Secondary | ICD-10-CM | POA: Diagnosis not present

## 2020-05-20 DIAGNOSIS — Z20822 Contact with and (suspected) exposure to covid-19: Secondary | ICD-10-CM | POA: Diagnosis not present

## 2020-05-20 DIAGNOSIS — Z7984 Long term (current) use of oral hypoglycemic drugs: Secondary | ICD-10-CM | POA: Insufficient documentation

## 2020-05-20 DIAGNOSIS — S065XAA Traumatic subdural hemorrhage with loss of consciousness status unknown, initial encounter: Secondary | ICD-10-CM

## 2020-05-20 DIAGNOSIS — I6200 Nontraumatic subdural hemorrhage, unspecified: Secondary | ICD-10-CM | POA: Diagnosis not present

## 2020-05-20 DIAGNOSIS — I10 Essential (primary) hypertension: Secondary | ICD-10-CM | POA: Insufficient documentation

## 2020-05-20 DIAGNOSIS — R519 Headache, unspecified: Secondary | ICD-10-CM | POA: Diagnosis present

## 2020-05-20 DIAGNOSIS — S065X9A Traumatic subdural hemorrhage with loss of consciousness of unspecified duration, initial encounter: Secondary | ICD-10-CM

## 2020-05-20 DIAGNOSIS — F17210 Nicotine dependence, cigarettes, uncomplicated: Secondary | ICD-10-CM | POA: Diagnosis not present

## 2020-05-20 LAB — CBC WITH DIFFERENTIAL/PLATELET
Abs Immature Granulocytes: 0.02 10*3/uL (ref 0.00–0.07)
Basophils Absolute: 0 10*3/uL (ref 0.0–0.1)
Basophils Relative: 0 %
Eosinophils Absolute: 0.4 10*3/uL (ref 0.0–0.5)
Eosinophils Relative: 5 %
HCT: 35.3 % — ABNORMAL LOW (ref 39.0–52.0)
Hemoglobin: 11.9 g/dL — ABNORMAL LOW (ref 13.0–17.0)
Immature Granulocytes: 0 %
Lymphocytes Relative: 24 %
Lymphs Abs: 1.8 10*3/uL (ref 0.7–4.0)
MCH: 27.9 pg (ref 26.0–34.0)
MCHC: 33.7 g/dL (ref 30.0–36.0)
MCV: 82.9 fL (ref 80.0–100.0)
Monocytes Absolute: 0.6 10*3/uL (ref 0.1–1.0)
Monocytes Relative: 8 %
Neutro Abs: 4.9 10*3/uL (ref 1.7–7.7)
Neutrophils Relative %: 63 %
Platelets: 487 10*3/uL — ABNORMAL HIGH (ref 150–400)
RBC: 4.26 MIL/uL (ref 4.22–5.81)
RDW: 15.5 % (ref 11.5–15.5)
WBC: 7.7 10*3/uL (ref 4.0–10.5)
nRBC: 0 % (ref 0.0–0.2)

## 2020-05-20 LAB — COMPREHENSIVE METABOLIC PANEL
ALT: 18 U/L (ref 0–44)
AST: 16 U/L (ref 15–41)
Albumin: 3.5 g/dL (ref 3.5–5.0)
Alkaline Phosphatase: 66 U/L (ref 38–126)
Anion gap: 9 (ref 5–15)
BUN: 9 mg/dL (ref 6–20)
CO2: 26 mmol/L (ref 22–32)
Calcium: 8.8 mg/dL — ABNORMAL LOW (ref 8.9–10.3)
Chloride: 102 mmol/L (ref 98–111)
Creatinine, Ser: 0.81 mg/dL (ref 0.61–1.24)
GFR, Estimated: >60 mL/min (ref >60)
Glucose, Bld: 109 mg/dL — ABNORMAL HIGH (ref 70–99)
Potassium: 3.8 mmol/L (ref 3.5–5.1)
Sodium: 137 mmol/L (ref 135–145)
Total Bilirubin: 0.4 mg/dL (ref 0.3–1.2)
Total Protein: 7.9 g/dL (ref 6.5–8.1)

## 2020-05-20 LAB — PROTIME-INR
INR: 0.9 (ref 0.8–1.2)
Prothrombin Time: 12.1 seconds (ref 11.4–15.2)

## 2020-05-20 MED ORDER — ONDANSETRON HCL 4 MG/2ML IJ SOLN
4.0000 mg | Freq: Once | INTRAMUSCULAR | Status: AC
  Administered 2020-05-20: 4 mg via INTRAVENOUS
  Filled 2020-05-20: qty 2

## 2020-05-20 MED ORDER — MORPHINE SULFATE (PF) 4 MG/ML IV SOLN
4.0000 mg | INTRAVENOUS | Status: DC | PRN
  Administered 2020-05-20: 4 mg via INTRAVENOUS
  Filled 2020-05-20: qty 1

## 2020-05-20 MED ORDER — HYDROMORPHONE HCL 1 MG/ML IJ SOLN
0.5000 mg | INTRAMUSCULAR | Status: DC | PRN
  Administered 2020-05-20: 0.5 mg via INTRAVENOUS
  Filled 2020-05-20: qty 1

## 2020-05-20 NOTE — ED Triage Notes (Signed)
Pt presents via POV c/o headache x1 month. Pt reports recent CT head with "fluid on the brain" but discharged. Reports neurologist sent to ED for repeat CT. A&Ox4. Pt in NAD at this time.
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
## 2020-05-20 NOTE — ED Notes (Signed)
Pt picked up for transfer while RN was unavailable.  No e-signature obtained by pateint.  Pt verbalized understanding and agreance of transfer to [facility] in presence of RN and MD.

## 2020-05-21 LAB — SARS CORONAVIRUS 2 (TAT 6-24 HRS): SARS Coronavirus 2: NEGATIVE

## 2020-12-04 ENCOUNTER — Other Ambulatory Visit: Payer: Self-pay

## 2020-12-04 ENCOUNTER — Emergency Department: Payer: BC Managed Care – PPO

## 2020-12-04 ENCOUNTER — Encounter: Payer: Self-pay | Admitting: Emergency Medicine

## 2020-12-04 ENCOUNTER — Emergency Department
Admission: EM | Admit: 2020-12-04 | Discharge: 2020-12-04 | Disposition: A | Discharge status: Home / Self Care | Payer: BC Managed Care – PPO | Source: Home / Self Care | Attending: Emergency Medicine | Admitting: Emergency Medicine

## 2020-12-04 ENCOUNTER — Emergency Department
Admission: EM | Admit: 2020-12-04 | Discharge: 2020-12-04 | Disposition: A | Discharge status: Home / Self Care | Payer: BC Managed Care – PPO | Attending: Emergency Medicine | Admitting: Emergency Medicine

## 2020-12-04 DIAGNOSIS — G4700 Insomnia, unspecified: Secondary | ICD-10-CM | POA: Insufficient documentation

## 2020-12-04 DIAGNOSIS — Z79899 Other long term (current) drug therapy: Secondary | ICD-10-CM | POA: Insufficient documentation

## 2020-12-04 DIAGNOSIS — I10 Essential (primary) hypertension: Secondary | ICD-10-CM | POA: Insufficient documentation

## 2020-12-04 DIAGNOSIS — Z7984 Long term (current) use of oral hypoglycemic drugs: Secondary | ICD-10-CM | POA: Insufficient documentation

## 2020-12-04 DIAGNOSIS — F17210 Nicotine dependence, cigarettes, uncomplicated: Secondary | ICD-10-CM | POA: Insufficient documentation

## 2020-12-04 DIAGNOSIS — E119 Type 2 diabetes mellitus without complications: Secondary | ICD-10-CM | POA: Insufficient documentation

## 2020-12-04 LAB — CBC WITH DIFFERENTIAL/PLATELET
Abs Immature Granulocytes: 0.01 10*3/uL (ref 0.00–0.07)
Basophils Absolute: 0 10*3/uL (ref 0.0–0.1)
Basophils Relative: 0 %
Eosinophils Absolute: 0.9 10*3/uL — ABNORMAL HIGH (ref 0.0–0.5)
Eosinophils Relative: 14 %
HCT: 42.2 % (ref 39.0–52.0)
Hemoglobin: 14.4 g/dL (ref 13.0–17.0)
Immature Granulocytes: 0 %
Lymphocytes Relative: 42 %
Lymphs Abs: 2.8 10*3/uL (ref 0.7–4.0)
MCH: 29.1 pg (ref 26.0–34.0)
MCHC: 34.1 g/dL (ref 30.0–36.0)
MCV: 85.3 fL (ref 80.0–100.0)
Monocytes Absolute: 0.5 10*3/uL (ref 0.1–1.0)
Monocytes Relative: 8 %
Neutro Abs: 2.4 10*3/uL (ref 1.7–7.7)
Neutrophils Relative %: 36 %
Platelets: 267 10*3/uL (ref 150–400)
RBC: 4.95 MIL/uL (ref 4.22–5.81)
RDW: 16.6 % — ABNORMAL HIGH (ref 11.5–15.5)
WBC: 6.7 10*3/uL (ref 4.0–10.5)
nRBC: 0 % (ref 0.0–0.2)

## 2020-12-04 LAB — BASIC METABOLIC PANEL
Anion gap: 8 (ref 5–15)
BUN: 12 mg/dL (ref 6–20)
CO2: 26 mmol/L (ref 22–32)
Calcium: 9.9 mg/dL (ref 8.9–10.3)
Chloride: 98 mmol/L (ref 98–111)
Creatinine, Ser: 0.97 mg/dL (ref 0.61–1.24)
GFR, Estimated: >60 mL/min (ref >60)
Glucose, Bld: 89 mg/dL (ref 70–99)
Potassium: 3.6 mmol/L (ref 3.5–5.1)
Sodium: 132 mmol/L — ABNORMAL LOW (ref 135–145)

## 2020-12-04 LAB — TROPONIN I (HIGH SENSITIVITY): Troponin I (High Sensitivity): 9 ng/L (ref <18)

## 2020-12-04 MED ORDER — LORAZEPAM 0.5 MG PO TABS: 0.5000 mg | ORAL_TABLET | Freq: Every day | ORAL | 0 refills | Status: AC

## 2020-12-04 MED ORDER — HYDROXYZINE PAMOATE 25 MG PO CAPS: 25.0000 mg | ORAL_CAPSULE | Freq: Three times a day (TID) | ORAL | 0 refills | Status: AC | PRN

## 2020-12-04 NOTE — ED Triage Notes (Signed)
Pt comes into the ED via POV c/o insomnia.  Pt states he can fall asleep but then he weill wake up suddenly with the feeling that he is "falling in the dream".  Pt also states he isnt sure if it is anxiety driven.  Pt did have brain surgery in the beginning of the year but has had no problems with sleep until this past week.  Pt denies taking any sleep medication.
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
## 2020-12-04 NOTE — Discharge Instructions (Addendum)
You are being trialed on a medicine known to help increase sleep and reduce anxiety.  Use medicine as prescribed, follow-up with primary provider for ongoing management.  Return to the ED if needed.

## 2020-12-04 NOTE — ED Triage Notes (Signed)
Pt seen earlier in ED for insomnia x5 days. Pt back tonight due to feeling "Jitery" after taking prescribed insomnia medication. Pt had full workup earlier today by provider.

## 2020-12-04 NOTE — ED Notes (Signed)
Per Katharina Caper, MD, no protocols needed at this time.
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
## 2023-06-13 ENCOUNTER — Emergency Department
Admission: EM | Admit: 2023-06-13 | Discharge: 2023-06-13 | Disposition: A | Discharge status: Home / Self Care | Payer: BC Managed Care – PPO | Attending: Emergency Medicine | Admitting: Emergency Medicine

## 2023-06-13 ENCOUNTER — Other Ambulatory Visit: Payer: Self-pay

## 2023-06-13 ENCOUNTER — Encounter: Payer: Self-pay | Admitting: Emergency Medicine

## 2023-06-13 DIAGNOSIS — H6122 Impacted cerumen, left ear: Secondary | ICD-10-CM | POA: Insufficient documentation

## 2023-06-13 DIAGNOSIS — F419 Anxiety disorder, unspecified: Secondary | ICD-10-CM | POA: Insufficient documentation

## 2023-06-13 DIAGNOSIS — Z7984 Long term (current) use of oral hypoglycemic drugs: Secondary | ICD-10-CM | POA: Insufficient documentation

## 2023-06-13 DIAGNOSIS — E119 Type 2 diabetes mellitus without complications: Secondary | ICD-10-CM | POA: Diagnosis not present

## 2023-06-13 DIAGNOSIS — R0981 Nasal congestion: Secondary | ICD-10-CM | POA: Insufficient documentation

## 2023-06-13 DIAGNOSIS — Z79899 Other long term (current) drug therapy: Secondary | ICD-10-CM | POA: Insufficient documentation

## 2023-06-13 DIAGNOSIS — H9192 Unspecified hearing loss, left ear: Secondary | ICD-10-CM | POA: Diagnosis present

## 2023-06-13 DIAGNOSIS — I10 Essential (primary) hypertension: Secondary | ICD-10-CM | POA: Insufficient documentation

## 2023-06-13 DIAGNOSIS — Z91148 Patient's other noncompliance with medication regimen for other reason: Secondary | ICD-10-CM | POA: Insufficient documentation

## 2023-06-13 MED ORDER — CARBAMIDE PEROXIDE 6.5 % OT SOLN
10.0000 [drp] | Freq: Once | OTIC | Status: AC
  Administered 2023-06-13: 10 [drp] via OTIC
  Filled 2023-06-13: qty 15

## 2023-06-13 MED ORDER — ACETAMINOPHEN 500 MG PO TABS
1000.0000 mg | ORAL_TABLET | Freq: Once | ORAL | Status: AC
  Administered 2023-06-13: 1000 mg via ORAL
  Filled 2023-06-13: qty 2
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
## 2023-06-13 NOTE — ED Triage Notes (Signed)
Patient ambulatory to triage with complaints of left ear pain and decreased hearing. Patient states both ears swelled up 3-4 days ago and he thought it was a psoriasis flare, he applied cream and swelling went down. Now cannot hear out of the left ear since yesterday. Patient does state he used Q-tips in both ears yesterday.

## 2023-07-16 ENCOUNTER — Encounter: Payer: Self-pay | Admitting: Family Medicine

## 2023-07-16 ENCOUNTER — Ambulatory Visit (INDEPENDENT_AMBULATORY_CARE_PROVIDER_SITE_OTHER): Payer: BC Managed Care – PPO | Admitting: Family Medicine

## 2023-07-16 VITALS — BP 180/90 | HR 83 | Ht 69.0 in | Wt 257.0 lb

## 2023-07-16 DIAGNOSIS — E669 Obesity, unspecified: Secondary | ICD-10-CM

## 2023-07-16 DIAGNOSIS — Z114 Encounter for screening for human immunodeficiency virus [HIV]: Secondary | ICD-10-CM

## 2023-07-16 DIAGNOSIS — L821 Other seborrheic keratosis: Secondary | ICD-10-CM

## 2023-07-16 DIAGNOSIS — F419 Anxiety disorder, unspecified: Secondary | ICD-10-CM

## 2023-07-16 DIAGNOSIS — L409 Psoriasis, unspecified: Secondary | ICD-10-CM | POA: Diagnosis not present

## 2023-07-16 DIAGNOSIS — I10 Essential (primary) hypertension: Secondary | ICD-10-CM

## 2023-07-16 DIAGNOSIS — Z1211 Encounter for screening for malignant neoplasm of colon: Secondary | ICD-10-CM

## 2023-07-16 DIAGNOSIS — F341 Dysthymic disorder: Secondary | ICD-10-CM | POA: Diagnosis not present

## 2023-07-16 DIAGNOSIS — Z1159 Encounter for screening for other viral diseases: Secondary | ICD-10-CM

## 2023-07-16 DIAGNOSIS — E559 Vitamin D deficiency, unspecified: Secondary | ICD-10-CM

## 2023-07-16 MED ORDER — HYDROCHLOROTHIAZIDE 25 MG PO TABS: 25.0000 mg | ORAL_TABLET | Freq: Every day | ORAL | 0 refills | Status: DC

## 2023-07-16 MED ORDER — LOSARTAN POTASSIUM 100 MG PO TABS: 100.0000 mg | ORAL_TABLET | Freq: Every evening | ORAL | 1 refills | Status: DC

## 2023-07-16 MED ORDER — HYDROXYZINE PAMOATE 25 MG PO CAPS: 25.0000 mg | ORAL_CAPSULE | Freq: Every evening | ORAL | 3 refills | Status: DC | PRN

## 2023-07-16 MED ORDER — HYDROCHLOROTHIAZIDE 25 MG PO TABS: 25.0000 mg | ORAL_TABLET | Freq: Every day | ORAL | 1 refills | Status: DC

## 2023-07-16 NOTE — Patient Instructions (Signed)
 VISIT SUMMARY:  Today, we discussed several health concerns and established a plan to manage them effectively. Your blood pressure is quite high, and we talked about the importance of taking your medications regularly. We also addressed your anxiety, ear congestion, psoriasis, and overall health maintenance. Additionally, we discussed lifestyle changes that can help improve your health.  YOUR PLAN:  -HYPERTENSION: Hypertension means high blood pressure, which can lead to serious health problems if not managed. We have prescribed losartan 100 mg daily and hydrochlorothiazide 25 mg daily, and you should take them in the evening to help you remember. Please monitor your blood pressure at home and aim for a goal of 130/70 mmHg. We also recommend reducing your salt intake, increasing physical activity, and losing weight.  -OBESITY: Obesity means having an excessive amount of body fat, which can affect your overall health and contribute to high blood pressure. We encourage you to start exercising for 20-30 minutes, four days a week, and to make dietary changes such as reducing high-calorie and high-sugar foods while increasing fruits, vegetables, and lean proteins.  -TYPE 2 DIABETES MELLITUS: Type 2 Diabetes Mellitus is a condition where your blood sugar levels are too high. It is important to take your metformin 500 mg daily as prescribed. We will check your HbA1c to see how well your blood sugar is being managed. Please try to follow a diet that reduces high-glycemic index foods.  -ANXIETY: Anxiety is a feeling of worry or fear that can affect your daily life and sleep. We have prescribed hydroxyzine 25 mg to take as needed for anxiety and sleep, up to 50 mg if necessary. Please follow up if your symptoms persist or worsen.  -PSORIASIS: Psoriasis is a skin condition that causes red, itchy, and scaly patches. You are currently managing it with adalimumab 40 mg injections every other Friday. We encourage you  to learn how to self-administer these injections for convenience. Follow up with your rheumatologist as needed.  -EAR CONGESTION: Ear congestion can occur after an ear infection and may cause a feeling of fullness or clogged ears. We examined your ears and found no signs of a residual infection. We recommend symptomatic treatment and reassurance.  -GENERAL HEALTH MAINTENANCE: We recommend a colonoscopy for colon cancer screening. Additionally, please reconsider getting the tetanus, pneumococcal, COVID-19, and shingles vaccinations to protect your health.  INSTRUCTIONS:  Please schedule a follow-up appointment in 3 weeks. We will also order a complete metabolic panel, HbA1c, CBC, and lipid panel to monitor your health.
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
## 2023-07-17 ENCOUNTER — Other Ambulatory Visit: Payer: Self-pay | Admitting: Family Medicine

## 2023-07-17 DIAGNOSIS — E559 Vitamin D deficiency, unspecified: Secondary | ICD-10-CM

## 2023-07-17 DIAGNOSIS — L821 Other seborrheic keratosis: Secondary | ICD-10-CM | POA: Insufficient documentation

## 2023-07-17 DIAGNOSIS — E1169 Type 2 diabetes mellitus with other specified complication: Secondary | ICD-10-CM

## 2023-07-17 LAB — CBC
Hematocrit: 43.5 % (ref 37.5–51.0)
Hemoglobin: 15.3 g/dL (ref 13.0–17.7)
MCH: 30 pg (ref 26.6–33.0)
MCHC: 35.2 g/dL (ref 31.5–35.7)
MCV: 85 fL (ref 79–97)
Platelets: 314 10*3/uL (ref 150–450)
RBC: 5.1 x10E6/uL (ref 4.14–5.80)
RDW: 15.6 % — ABNORMAL HIGH (ref 11.6–15.4)
WBC: 8.1 10*3/uL (ref 3.4–10.8)

## 2023-07-17 LAB — CMP14+EGFR
ALT: 20 [IU]/L (ref 0–44)
AST: 21 [IU]/L (ref 0–40)
Albumin: 5 g/dL — ABNORMAL HIGH (ref 3.8–4.9)
Alkaline Phosphatase: 69 [IU]/L (ref 44–121)
BUN/Creatinine Ratio: 16 (ref 9–20)
BUN: 19 mg/dL (ref 6–24)
Bilirubin Total: 0.2 mg/dL (ref 0.0–1.2)
CO2: 28 mmol/L (ref 20–29)
Calcium: 10.7 mg/dL — ABNORMAL HIGH (ref 8.7–10.2)
Chloride: 98 mmol/L (ref 96–106)
Creatinine, Ser: 1.19 mg/dL (ref 0.76–1.27)
Globulin, Total: 2.8 g/dL (ref 1.5–4.5)
Glucose: 112 mg/dL — ABNORMAL HIGH (ref 70–99)
Potassium: 3.7 mmol/L (ref 3.5–5.2)
Sodium: 140 mmol/L (ref 134–144)
Total Protein: 7.8 g/dL (ref 6.0–8.5)
eGFR: 71 mL/min/{1.73_m2} (ref >59)

## 2023-07-17 LAB — VITAMIN D 25 HYDROXY (VIT D DEFICIENCY, FRACTURES): Vit D, 25-Hydroxy: 11.8 ng/mL — ABNORMAL LOW (ref 30.0–100.0)

## 2023-07-17 LAB — HIV ANTIBODY (ROUTINE TESTING W REFLEX): HIV Screen 4th Generation wRfx: NONREACTIVE

## 2023-07-17 LAB — HEMOGLOBIN A1C
Est. average glucose Bld gHb Est-mCnc: 148 mg/dL
Hgb A1c MFr Bld: 6.8 % — ABNORMAL HIGH (ref 4.8–5.6)

## 2023-07-17 LAB — LIPID PANEL
Chol/HDL Ratio: 5.6 {ratio} — ABNORMAL HIGH (ref 0.0–5.0)
Cholesterol, Total: 197 mg/dL (ref 100–199)
HDL: 35 mg/dL — ABNORMAL LOW (ref >39)
LDL Chol Calc (NIH): 116 mg/dL — ABNORMAL HIGH (ref 0–99)
Triglycerides: 263 mg/dL — ABNORMAL HIGH (ref 0–149)
VLDL Cholesterol Cal: 46 mg/dL — ABNORMAL HIGH (ref 5–40)

## 2023-07-17 LAB — TSH+T4F+T3FREE
Free T4: 0.91 ng/dL (ref 0.82–1.77)
T3, Free: 3.7 pg/mL (ref 2.0–4.4)
TSH: 3.21 u[IU]/mL (ref 0.450–4.500)

## 2023-07-17 LAB — HEPATITIS C ANTIBODY: Hep C Virus Ab: NONREACTIVE

## 2023-07-17 MED ORDER — SIMVASTATIN 20 MG PO TABS: 20.0000 mg | ORAL_TABLET | Freq: Every evening | ORAL | 3 refills | Status: DC

## 2023-07-17 MED ORDER — VITAMIN D (ERGOCALCIFEROL) 1.25 MG (50000 UNIT) PO CAPS: 50000.0000 [IU] | ORAL_CAPSULE | ORAL | 1 refills | Status: DC

## 2023-07-17 NOTE — Assessment & Plan Note (Signed)
 Chronic, uncontrolled Severely elevated blood pressure at 180/90 mmHg. Non-adherence to losartan and hydrochlorothiazide due to forgetfulness and inconvenience of morning dosing. Discussed importance of adherence and potential nocturia with nighttime hydrochlorothiazide. Prefers evening dosing. Goal BP: 130/70 mmHg. Discussed lifestyle modifications: reducing salt intake (<2300 mg/day), increasing physical activity, and weight loss. - Prescribe losartan 100 mg daily and hydrochlorothiazide 25 mg daily - Advise evening dosing to improve adherence - Recheck BP in 3 weeks - Encourage home BP monitoring with goal of 130/70 mmHg - Reinforce lifestyle modifications

## 2023-07-19 ENCOUNTER — Other Ambulatory Visit: Payer: Self-pay

## 2023-07-25 ENCOUNTER — Encounter: Payer: Self-pay | Admitting: *Deleted

## 2023-08-21 ENCOUNTER — Encounter: Payer: Self-pay | Admitting: Family Medicine

## 2023-08-21 ENCOUNTER — Ambulatory Visit (INDEPENDENT_AMBULATORY_CARE_PROVIDER_SITE_OTHER): Payer: BC Managed Care – PPO | Admitting: Family Medicine

## 2023-08-21 VITALS — BP 176/86 | HR 70 | Ht 69.0 in | Wt 260.0 lb

## 2023-08-21 DIAGNOSIS — Z7984 Long term (current) use of oral hypoglycemic drugs: Secondary | ICD-10-CM

## 2023-08-21 DIAGNOSIS — E1169 Type 2 diabetes mellitus with other specified complication: Secondary | ICD-10-CM | POA: Diagnosis not present

## 2023-08-21 DIAGNOSIS — R61 Generalized hyperhidrosis: Secondary | ICD-10-CM | POA: Diagnosis not present

## 2023-08-21 DIAGNOSIS — I10 Essential (primary) hypertension: Secondary | ICD-10-CM

## 2023-08-21 DIAGNOSIS — E785 Hyperlipidemia, unspecified: Secondary | ICD-10-CM

## 2023-08-21 DIAGNOSIS — E559 Vitamin D deficiency, unspecified: Secondary | ICD-10-CM

## 2023-08-21 MED ORDER — LOSARTAN POTASSIUM 100 MG PO TABS: 100.0000 mg | ORAL_TABLET | Freq: Every day | ORAL | 1 refills | Status: DC

## 2023-08-21 MED ORDER — HYDRALAZINE HCL 10 MG PO TABS: 10.0000 mg | ORAL_TABLET | Freq: Two times a day (BID) | ORAL | 2 refills | Status: DC

## 2023-08-21 MED ORDER — VITAMIN D (ERGOCALCIFEROL) 1.25 MG (50000 UNIT) PO CAPS: 50000.0000 [IU] | ORAL_CAPSULE | ORAL | 1 refills | Status: DC

## 2023-08-21 MED ORDER — OZEMPIC (0.25 OR 0.5 MG/DOSE) 2 MG/1.5ML ~~LOC~~ SOPN: 0.2500 mg | PEN_INJECTOR | SUBCUTANEOUS | 0 refills | Status: DC

## 2023-08-21 MED ORDER — ROSUVASTATIN CALCIUM 20 MG PO TABS: 20.0000 mg | ORAL_TABLET | Freq: Every day | ORAL | 3 refills | Status: DC

## 2023-08-21 NOTE — Patient Instructions (Signed)
 VISIT SUMMARY:  During today's visit, we discussed your current health status and addressed several ongoing health concerns, including your hypertension, diabetes, obesity, hyperlipidemia, vitamin D deficiency, and night sweats. We reviewed your medications and made some adjustments to better manage your conditions. We also discussed lifestyle modifications to improve your overall health.  YOUR PLAN:  -HYPERTENSION: Hypertension, or high blood pressure, is not well-controlled at this time. We need to ensure you are taking your losartan 100 mg daily as prescribed. We are adding hydralazine 10 mg twice daily to help lower your blood pressure. It's important to make dietary changes and stop smoking cigars to help manage your blood pressure. If your blood pressure remains high, we may need to do a renal ultrasound.  -TYPE 2 DIABETES MELLITUS: Your type 2 diabetes is well-controlled with an A1c of 6.8%. Continue taking metformin 500 mg daily. We are starting you on Ozempic 0.25 mg once weekly to help with weight management and improve blood sugar control. Be aware of potential side effects like nausea and constipation.  -Diabetes & OBESITY: Obesity can worsen conditions like diabetes and hypertension. We are starting you on Ozempic 0.25 mg once weekly to help with weight loss. Additionally, it's important to make dietary changes and increase physical activity.  -HYPERLIPIDEMIA: Hyperlipidemia, or high cholesterol, is not well-controlled. We are switching you from simvastatin to Crestor to better manage your cholesterol levels and reduce your cardiovascular risk. We will monitor your lipid levels to see how you respond to the new medication.  -VITAMIN D DEFICIENCY: Vitamin D deficiency means you have low levels of vitamin D, which is important for bone health. Make sure to take your vitamin D supplement once weekly as prescribed. We will send your prescription to the mail order pharmacy.  -NIGHT SWEATS:  Night sweats can be caused by various conditions. We will do some tests to find the cause, including a Quantgold test for tuberculosis, rechecking your calcium levels, and a catecholamine test to rule out pheochromocytoma.  INSTRUCTIONS:  Please ensure you are taking your medications as prescribed. We will start you on new medications for your blood pressure, diabetes, and cholesterol. Make sure to follow the dietary and lifestyle recommendations we discussed. We will also conduct some tests to investigate the cause of your night sweats. Follow up with [place] after completing the tests or sooner if you have any concerns.
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
## 2023-08-22 DIAGNOSIS — E119 Type 2 diabetes mellitus without complications: Secondary | ICD-10-CM | POA: Insufficient documentation

## 2023-08-22 DIAGNOSIS — R61 Generalized hyperhidrosis: Secondary | ICD-10-CM | POA: Insufficient documentation

## 2023-08-22 DIAGNOSIS — E1169 Type 2 diabetes mellitus with other specified complication: Secondary | ICD-10-CM | POA: Insufficient documentation

## 2023-08-22 NOTE — Assessment & Plan Note (Signed)
 Chronic  Hypertension is poorly controlled with a blood pressure of 176/86 mmHg. He is on hydrochlorothiazide 25 mg daily and losartan 100 mg daily, but adherence to losartan is uncertain. Dietary indiscretions and occasional cigar use may contribute to elevated blood pressure. Cardiovascular risk is estimated at 38% over the next ten years. - Ensure adherence to losartan 100 mg daily - Add hydralazine 10 mg twice daily - Consider renal ultrasound if blood pressure remains uncontrolled - Encourage dietary modifications and smoking cessation

## 2023-08-22 NOTE — Assessment & Plan Note (Addendum)
 Vitamin D deficiency with a level of 11 ng/mL. He has been taking vitamin D supplements inconsistently. - Ensure adherence to vitamin D supplementation once weekly - Send prescription for vitamin D to mail order pharmacy

## 2023-08-22 NOTE — Assessment & Plan Note (Signed)
 Chronic Hyperlipidemia is suboptimally controlled with an LDL of 116 mg/dL. He was previously on simvastatin but will be switched to Crestor to achieve better lipid control and reduce cardiovascular risk. - Discontinue simvastatin - start crestor 20mg   - Monitor lipid levels

## 2023-08-22 NOTE — Assessment & Plan Note (Signed)
 Chronic Type 2 diabetes is well-controlled with an A1c of 6.8%. He is on metformin 500 mg daily. Plans to initiate Ozempic to aid in weight management and further improve glycemic control. Discussed potential side effects of Ozempic, including nausea and constipation. - Continue metformin 500 mg daily - Initiate Ozempic 0.25 mg once weekly - Monitor for side effects of Ozempic, including nausea and constipation

## 2023-08-27 LAB — QUANTIFERON-TB GOLD PLUS
QuantiFERON Mitogen Value: >10 [IU]/mL
QuantiFERON Nil Value: 0.1 [IU]/mL
QuantiFERON TB1 Ag Value: 0.12 [IU]/mL
QuantiFERON TB2 Ag Value: 0.12 [IU]/mL
QuantiFERON-TB Gold Plus: NEGATIVE

## 2023-08-27 LAB — BASIC METABOLIC PANEL WITH GFR
BUN/Creatinine Ratio: 12 (ref 9–20)
BUN: 14 mg/dL (ref 6–24)
CO2: 25 mmol/L (ref 20–29)
Calcium: 10.1 mg/dL (ref 8.7–10.2)
Chloride: 97 mmol/L (ref 96–106)
Creatinine, Ser: 1.14 mg/dL (ref 0.76–1.27)
Glucose: 106 mg/dL — ABNORMAL HIGH (ref 70–99)
Potassium: 4.3 mmol/L (ref 3.5–5.2)
Sodium: 139 mmol/L (ref 134–144)
eGFR: 74 mL/min/{1.73_m2} (ref >59)

## 2023-08-27 LAB — CATECHOLAMINES, FRACTIONATED, PLASMA
Epinephrine: 70 pg/mL — ABNORMAL HIGH (ref 0–62)
Norepinephrine: 559 pg/mL (ref 0–874)

## 2023-08-28 NOTE — Addendum Note (Signed)
 Addended by: Bing Neighbors on: 08/28/2023 07:22 AM   Modules accepted: Orders

## 2023-08-30 ENCOUNTER — Ambulatory Visit
Admission: RE | Admit: 2023-08-30 | Discharge: 2023-08-30 | Disposition: A | Discharge status: Home / Self Care | Source: Ambulatory Visit | Attending: Family Medicine | Admitting: Family Medicine

## 2023-08-30 ENCOUNTER — Telehealth: Payer: Self-pay | Admitting: Family Medicine

## 2023-08-30 DIAGNOSIS — R61 Generalized hyperhidrosis: Secondary | ICD-10-CM | POA: Diagnosis present

## 2023-08-30 DIAGNOSIS — I10 Essential (primary) hypertension: Secondary | ICD-10-CM | POA: Insufficient documentation

## 2023-08-30 NOTE — Telephone Encounter (Signed)
 Copied from CRM [id number]. Topic: Clinical - Medication Question >> Aug 30, 2023 11:46 AM Elle L wrote: Reason for CRM: The patient states he received his package of Semaglutide,0.25 or 0.5MG /DOS, (OZEMPIC, 0.25 OR 0.5 MG/DOSE,) 2 MG/1.5ML SOPN in the mail and is requesting a call back to go over how to inject it at [phone].

## 2023-09-02 ENCOUNTER — Encounter: Payer: Self-pay | Admitting: Family Medicine

## 2023-09-29 ENCOUNTER — Other Ambulatory Visit: Payer: Self-pay | Admitting: Family Medicine

## 2023-09-29 DIAGNOSIS — E1169 Type 2 diabetes mellitus with other specified complication: Secondary | ICD-10-CM

## 2023-10-01 NOTE — Telephone Encounter (Signed)
 Lab in date.  Requested Prescriptions  Pending Prescriptions Disp Refills   OZEMPIC , 0.25 OR 0.5 MG/DOSE, 2 MG/3ML SOPN [Pharmacy Med Name: OZEMPIC   0.25MG  0.5MG  SOLUTION PEN-INJECTOR  PEN DOSE] 3 mL 7    Sig: INJECT 0.25 MG SUBCUTANEOUSLY  WEEKLY FOR 4 WEEKS, THEN 0.5 MG  WEEKLY THEREAFTER     Endocrinology:  Diabetes - GLP-1 Receptor Agonists - semaglutide  Failed - 10/01/2023 11:31 AM      Failed - HBA1C in normal range and within 180 days    Hgb A1c MFr Bld  Date Value Ref Range Status  07/16/2023 6.8 (H) 4.8 - 5.6 % Final    Comment:             Prediabetes: 5.7 - 6.4          Diabetes: >6.4          Glycemic control for adults with diabetes: <7.0          Passed - Cr in normal range and within 360 days    Creatinine  Date Value Ref Range Status  07/24/2013 1.21 0.60 - 1.30 mg/dL Final   Creatinine, Ser  Date Value Ref Range Status  08/21/2023 1.14 0.76 - 1.27 mg/dL Final         Passed - Valid encounter within last 6 months    Recent Outpatient Visits           1 month ago Primary hypertension   [place] [facility], Judyann Number, MD   2 months ago Primary hypertension   [place] [place], Judyann Number, MD

## 2023-10-31 ENCOUNTER — Other Ambulatory Visit: Payer: Self-pay | Admitting: Family Medicine

## 2023-10-31 DIAGNOSIS — I10 Essential (primary) hypertension: Secondary | ICD-10-CM

## 2023-12-12 ENCOUNTER — Ambulatory Visit: Admitting: Family Medicine

## 2023-12-12 ENCOUNTER — Ambulatory Visit: Admitting: Physician Assistant

## 2023-12-20 ENCOUNTER — Ambulatory Visit (INDEPENDENT_AMBULATORY_CARE_PROVIDER_SITE_OTHER): Admitting: Physician Assistant

## 2023-12-20 ENCOUNTER — Encounter: Payer: Self-pay | Admitting: Physician Assistant

## 2023-12-20 VITALS — BP 188/90 | HR 61 | Resp 16 | Ht 69.0 in | Wt 258.5 lb

## 2023-12-20 DIAGNOSIS — I10 Essential (primary) hypertension: Secondary | ICD-10-CM

## 2023-12-20 DIAGNOSIS — R1013 Epigastric pain: Secondary | ICD-10-CM | POA: Diagnosis not present

## 2023-12-20 DIAGNOSIS — Z9889 Other specified postprocedural states: Secondary | ICD-10-CM

## 2023-12-20 DIAGNOSIS — R1012 Left upper quadrant pain: Secondary | ICD-10-CM

## 2023-12-20 DIAGNOSIS — Z8719 Personal history of other diseases of the digestive system: Secondary | ICD-10-CM

## 2023-12-20 DIAGNOSIS — R399 Unspecified symptoms and signs involving the genitourinary system: Secondary | ICD-10-CM | POA: Diagnosis not present

## 2023-12-20 DIAGNOSIS — R1030 Lower abdominal pain, unspecified: Secondary | ICD-10-CM

## 2023-12-20 DIAGNOSIS — E785 Hyperlipidemia, unspecified: Secondary | ICD-10-CM

## 2023-12-20 DIAGNOSIS — F1994 Other psychoactive substance use, unspecified with psychoactive substance-induced mood disorder: Secondary | ICD-10-CM

## 2023-12-20 DIAGNOSIS — F419 Anxiety disorder, unspecified: Secondary | ICD-10-CM

## 2023-12-20 DIAGNOSIS — E1169 Type 2 diabetes mellitus with other specified complication: Secondary | ICD-10-CM

## 2023-12-20 DIAGNOSIS — F341 Dysthymic disorder: Secondary | ICD-10-CM

## 2023-12-20 DIAGNOSIS — E669 Obesity, unspecified: Secondary | ICD-10-CM

## 2023-12-20 DIAGNOSIS — F1410 Cocaine abuse, uncomplicated: Secondary | ICD-10-CM

## 2023-12-20 MED ORDER — OMEPRAZOLE 20 MG PO CPDR: 20.0000 mg | DELAYED_RELEASE_CAPSULE | Freq: Two times a day (BID) | ORAL | 3 refills | Status: DC

## 2023-12-20 NOTE — Progress Notes (Signed)
 Established patient visit  Patient: Donald Oconnell   DOB: 1965/12/07   58 y.o. Male  MRN: [id number] Visit Date: 12/20/2023  Today's healthcare provider: Jolynn Spencer, PA-C   Chief Complaint  Patient presents with   Flank Pain    L side pain on and off for a while. Feels like not enough room (pressure pain) no otc medication taken to help   Subjective      Discussed the use of AI scribe software for clinical note transcription with the patient, who gave verbal consent to proceed.  History of Present Illness Donald Oconnell is a 58 year old male who presents with abdominal pain and urinary symptoms.  Abdominal pain is primarily in the left upper quadrant with a pressure-like sensation, sometimes sharp in the lower abdomen. Pain is severe enough to require repositioning and is intermittent, with relief associated with bowel movements. Lower abdominal pain is similar to pre-hernia repair pain, with no bulging noted. Bowel movements occur every other day without significant changes in stool color or consistency.  Urinary symptoms include a slow urine stream with a strong urge to urinate, without burning or changes in urine color.  He experiences difficulty sleeping, often only two hours at a time, with a 'spinning' mind. He denies depression or anxiety but acknowledges job-related stress. He has not completed a formal questionnaire for depression or anxiety.  He eats what he wants, lives alone, and consumes alcohol moderately. He experiences occasional heartburn, particularly when driving or after consuming certain foods like milk. He sometimes passes out after eating.     12/20/2023    4:11 PM 12/20/2023    4:07 PM 07/16/2023    3:31 PM  PHQ9 SCORE ONLY  PHQ-9 Total Score 9 0 3      07/16/2023    3:33 PM  GAD 7 : Generalized Anxiety Score  Nervous, Anxious, on Edge 1  Control/stop worrying 1  Worry too much - different things 0  Trouble relaxing 1  Restless 0  Easily annoyed or  irritable 3  Afraid - awful might happen 3  Total GAD 7 Score 9  Anxiety Difficulty Not difficult at all     Medications: Outpatient Medications Prior to Visit  Medication Sig   Adalimumab 40 MG/0.8ML PNKT Inject 40 mg as directed every 14 (fourteen) days. Every other Fri   hydrALAZINE  (APRESOLINE ) 10 MG tablet Take 1 tablet (10 mg total) by mouth 2 (two) times daily.   hydrochlorothiazide  (HYDRODIURIL ) 25 MG tablet Take 1 tablet (25 mg total) by mouth daily.   hydrOXYzine  (VISTARIL ) 25 MG capsule Take 1-2 capsules (25-50 mg total) by mouth at bedtime as needed.   ibuprofen  (ADVIL ) 200 MG tablet Take 400 mg by mouth every 6 (six) hours as needed for headache or moderate pain. (Patient not taking: Reported on 08/21/2023)   losartan  (COZAAR ) 100 MG tablet Take 1 tablet (100 mg total) by mouth daily.   metFORMIN (GLUCOPHAGE) 500 MG tablet Take 500 mg by mouth daily with breakfast.    rosuvastatin  (CRESTOR ) 20 MG tablet Take 1 tablet (20 mg total) by mouth daily.   Semaglutide ,0.25 or 0.5MG /DOS, (OZEMPIC , 0.25 OR 0.5 MG/DOSE,) 2 MG/3ML SOPN INJECT 0.25 MG SUBCUTANEOUSLY  WEEKLY FOR 4 WEEKS, THEN 0.5 MG  WEEKLY THEREAFTER   Vitamin D , Ergocalciferol , (DRISDOL ) 1.25 MG (50000 UNIT) CAPS capsule Take 1 capsule (50,000 Units total) by mouth every 7 (seven) days.   No facility-administered medications prior to visit.    Review of Systems  All negative Except see HPI   {Insert previous labs (optional):23779} {See past labs  Heme  Chem  Endocrine  Serology  Results Review (optional):1}   Objective    BP (!) 188/90 (BP Location: Left Arm, Patient Position: Sitting, Cuff Size: Large)   Pulse 61   Resp 16   Ht 5' 9 (1.753 m)   Wt 258 lb 8 oz (117.3 kg)   SpO2 96%   BMI 38.17 kg/m  {Insert last BP/Wt (optional):23777}{See vitals history (optional):1}   Physical Exam Vitals reviewed.  Constitutional:      General: He is not in acute distress.    Appearance: Normal appearance. He is  not diaphoretic.  HENT:     Head: Normocephalic and atraumatic.  Eyes:     General: No scleral icterus.    Conjunctiva/sclera: Conjunctivae normal.  Cardiovascular:     Rate and Rhythm: Normal rate and regular rhythm.     Pulses: Normal pulses.     Heart sounds: Normal heart sounds. No murmur heard. Pulmonary:     Effort: Pulmonary effort is normal. No respiratory distress.     Breath sounds: Normal breath sounds. No wheezing or rhonchi.  Abdominal:     Tenderness: There is abdominal tenderness (epigastric, left upper q and lower abdominal).  Musculoskeletal:     Cervical back: Neck supple.     Right lower leg: No edema.     Left lower leg: No edema.  Lymphadenopathy:     Cervical: No cervical adenopathy.  Skin:    General: Skin is warm and dry.     Findings: No rash.  Neurological:     Mental Status: He is alert and oriented to person, place, and time. Mental status is at baseline.  Psychiatric:        Mood and Affect: Mood normal.        Behavior: Behavior normal.      No results found for any visits on 12/20/23.      Assessment & Plan Abdominal pain, intermittent, in epigastrium, left upper and lower quadrants with history of abdominal hernia and possible irritable bowel syndrome vs GERD vs  Intermittent sharp pain in left upper and lower quadrants, possibly IBS, constipation, or pancreatic issues. Pain linked to positions and activities. - Order blood work on Monday. - Start high fiber diet with yogurt and kefir. - Consider imaging if symptoms persist. - Prescribe medication post-blood work if necessary.  lower urinary tract symptoms/luts Slow urinary stream and difficulty initiating urination without infection signs. - Discuss prostate cancer screening. - Schedule appointment with primary care provider.  Insomnia Difficulty sleeping with two-hour intervals, possibly linked to anxiety and work stress.  Mild to moderate depression and anxiety Mild to moderate  symptoms related to work stress, denies hopelessness.  Heartburn (gastroesophageal reflux symptoms) Heartburn post-eating, sometimes mistaken for heart attack.    Orders Placed This Encounter  Procedures   CBC with Differential/Platelet   Comprehensive metabolic panel with GFR   Lipase   H. pylori breath test   PSA Total (Reflex To Free)    Return in about 2 weeks (around 01/03/2024), or with pcp in 2-3 weeks.   The patient was advised to call back or seek an in-person evaluation if the symptoms worsen or if the condition fails to improve as anticipated.  I discussed the assessment and treatment plan with the patient. The patient was provided an opportunity to ask questions and all were answered. The patient agreed with the plan and demonstrated  an understanding of the instructions.  I, Shelbey Spindler, PA-C have reviewed all documentation for this visit. The documentation on 12/20/2023  for the exam, diagnosis, procedures, and orders are all accurate and complete.  Jolynn Spencer, [facility], MMS [facility] [phone] (phone) [phone] (fax)  [facility]

## 2023-12-23 DIAGNOSIS — R1030 Lower abdominal pain, unspecified: Secondary | ICD-10-CM | POA: Insufficient documentation

## 2023-12-23 DIAGNOSIS — R1013 Epigastric pain: Secondary | ICD-10-CM | POA: Insufficient documentation

## 2023-12-23 DIAGNOSIS — R1012 Left upper quadrant pain: Secondary | ICD-10-CM | POA: Insufficient documentation

## 2023-12-23 DIAGNOSIS — R399 Unspecified symptoms and signs involving the genitourinary system: Secondary | ICD-10-CM | POA: Insufficient documentation

## 2023-12-25 ENCOUNTER — Ambulatory Visit

## 2024-01-08 ENCOUNTER — Ambulatory Visit: Payer: Self-pay

## 2024-01-08 NOTE — Telephone Encounter (Signed)
 Patient call note reviewed. Agree with scheduled appt.

## 2024-01-08 NOTE — Telephone Encounter (Signed)
 Pt was calling in to reschedule his appt, states he hasn't had time or money to go get blood work done, rescheduled for first available.

## 2024-01-08 NOTE — Telephone Encounter (Signed)
 FYI Only or Action Required?: FYI only for provider.  Patient was last seen in primary care on 08/21/2023 by Sharma Coyer, MD.  Called Nurse Triage reporting Abdominal Pain.  Symptoms began several months ago.  Interventions attempted: Nothing.  Symptoms are: unchanged.  Triage Disposition: See PCP Within 2 Weeks  Patient/caregiver understands and will follow disposition?: Yes      Copied from CRM #[id number]. Topic: Clinical - Red Word Triage >> Jan 08, 2024  9:20 AM Donald Oconnell wrote: Red Word that prompted transfer to Nurse Triage: Patient calling to reschedule his appointment for tomorrow, but states he is following up on pain in his side but it has gotten worse. Has not yet done his labs which is why he wanted to reschedule. Reason for Disposition  Abdominal pain is a chronic symptom (recurrent or ongoing AND present > 4 weeks)  Answer Assessment - Initial Assessment Questions 1. LOCATION: Where does it hurt?      Left side upper abd 2. RADIATION: Does the pain shoot anywhere else? (e.g., chest, back)     no 3. ONSET: When did the pain begin? (e.g., minutes, hours or days ago)      2-3 months 4. SUDDEN: Gradual or sudden onset?     sudden 5. PATTERN Does the pain come and go, or is it constant?     Comes and goes 6. SEVERITY: How bad is the pain?  (e.g., Scale 1-10; mild, moderate, or severe)     uncomfortable 7. RECURRENT SYMPTOM: Have you ever had this type of stomach pain before? If Yes, ask: When was the last time? and What happened that time?      yes 8. AGGRAVATING FACTORS: Does anything seem to cause this pain? (e.g., foods, stress, alcohol)     Sitting makes it uncomfortable  9. CARDIAC SYMPTOMS: Do you have any of the following symptoms: chest pain, difficulty breathing, sweating, nausea?     Sob that has been ongoing for a while, from time to time 10. OTHER SYMPTOMS: Do you have any other symptoms? (e.g., back pain, diarrhea,  fever, urination pain, vomiting)       no  Protocols used: Abdominal Pain - Upper-A-AH

## 2024-01-09 ENCOUNTER — Ambulatory Visit: Admitting: Family Medicine

## 2024-01-11 ENCOUNTER — Other Ambulatory Visit: Payer: Self-pay

## 2024-01-11 ENCOUNTER — Emergency Department
Admission: EM | Admit: 2024-01-11 | Discharge: 2024-01-11 | Disposition: A | Discharge status: Home / Self Care | Attending: Emergency Medicine | Admitting: Emergency Medicine

## 2024-01-11 DIAGNOSIS — H9201 Otalgia, right ear: Secondary | ICD-10-CM | POA: Diagnosis present

## 2024-01-11 DIAGNOSIS — I10 Essential (primary) hypertension: Secondary | ICD-10-CM | POA: Diagnosis not present

## 2024-01-11 DIAGNOSIS — H60501 Unspecified acute noninfective otitis externa, right ear: Secondary | ICD-10-CM | POA: Insufficient documentation

## 2024-01-11 MED ORDER — CIPROFLOXACIN-DEXAMETHASONE 0.3-0.1 % OT SUSP
4.0000 [drp] | Freq: Three times a day (TID) | OTIC | 0 refills | Status: AC
Start: 2024-01-11 — End: 2024-01-18

## 2024-01-11 NOTE — ED Triage Notes (Signed)
 Patient ambulatory to triage with complaints of ear pain x2 weeks. States now it is becoming hard to hear out of that ear.

## 2024-01-11 NOTE — ED Provider Notes (Signed)
 [facility] Provider Note    Event Date/Time   First MD Initiated Contact with Patient 01/11/24 [phone]     (approximate)   History   Otalgia   HPI  Donald Oconnell is a 58 y.o. male   Past medical history of psoriasis, hyperlipidemia, hypertension, who presents to the Emergency Department with ear pain.  He states the pain started after wearing earbuds about 2 weeks ago, he typically does not wear internal earphones due to irritation to his ear in the past.  He wore only on the right side.  That is where his pain is.  He denies any discharge.  He denies any nasal congestion or recent illnesses.  No fever.  Independent Historian contributed to assessment above: His brother is at bedside to corroborate information given above    Physical Exam   Triage Vital Signs: ED Triage Vitals  Encounter Vitals Group     BP 01/11/24 0543 (!) 208/116     Girls Systolic BP Percentile --      Girls Diastolic BP Percentile --      Boys Systolic BP Percentile --      Boys Diastolic BP Percentile --      Pulse Rate 01/11/24 0543 66     Resp 01/11/24 0543 18     Temp 01/11/24 0543 97.8 F (36.6 C)     Temp src --      SpO2 01/11/24 0543 99 %     Weight 01/11/24 0542 250 lb (113.4 kg)     Height 01/11/24 0542 5' 9 (1.753 m)     Head Circumference --      Peak Flow --      Pain Score 01/11/24 0542 10     Pain Loc --      Pain Education --      Exclude from Growth Chart --     Most recent vital signs: Vitals:   01/11/24 0543 01/11/24 0544  BP: (!) 208/116 (!) 202/100  Pulse: 66   Resp: 18   Temp: 97.8 F (36.6 C)   SpO2: 99%     General: Awake, no distress.  CV:  Good peripheral perfusion.  Resp:  Normal effort.  Abd:  No distention.  Other:  Right TM shows no obvious effusion or bulging.  There is no evidence of mastoiditis and no skin changes externally.  He does have some debris in the ear canal.  He does have pain with wiggling of the ear on the  right.   ED Results / Procedures / Treatments   Labs (all labs ordered are listed, but only abnormal results are displayed) Labs Reviewed - No data to display  PROCEDURES:  Critical Care performed: No  Procedures   MEDICATIONS ORDERED IN ED: Medications - No data to display  IMPRESSION / MDM / ASSESSMENT AND PLAN / ED COURSE  I reviewed the triage vital signs and the nursing notes.                                Patient's presentation is most consistent with acute complicated illness / injury requiring diagnostic workup.  Differential diagnosis includes, but is not limited to, otitis externa, malignant otitis externa, mastoiditis, acute otitis media  MDM: Exam and symptoms consistent with otitis externa.  Otherwise looks well doubt life-threatening infection at this time.  Will start on Ciprodex .  As a backup plan if symptoms  do not get better can call ENT for follow-up.  He does have high blood pressure in the setting of pain, already has primary doctor and is already on antihypertensive, I stressed the importance of continuing with his antihypertensive medication and to follow-up with his primary doctor.  Discharge.       FINAL CLINICAL IMPRESSION(S) / ED DIAGNOSES   Final diagnoses:  Acute otitis externa of right ear, unspecified type  Uncontrolled hypertension     Rx / DC Orders   ED Discharge Orders          Ordered    ciprofloxacin -dexamethasone  (CIPRODEX ) OTIC suspension  3 times daily        01/11/24 9357             Note:  This document was prepared using Dragon voice recognition software and may include unintentional dictation errors.    Cyrena Mylar, MD 01/11/24 [phone]

## 2024-01-11 NOTE — Discharge Instructions (Addendum)
 Use eardrops as prescribed.  If you continue to have pain past the weekend despite using this medication then call the ear doctor at the number attached for an appointment.  Your blood pressure was high today.  Continue taking your blood pressure medications and check your blood pressure once daily while at rest, and talk to your doctor about blood pressure management next week.  Thank you for choosing us  for your health care today!  Please see your primary doctor this week for a follow up appointment.   If you have any new, worsening, or unexpected symptoms call your doctor right away or come back to the emergency department for reevaluation.  It was my pleasure to care for you today.   Ginnie EDISON Cyrena, MD

## 2024-01-16 ENCOUNTER — Other Ambulatory Visit: Payer: Self-pay | Admitting: Family Medicine

## 2024-01-16 DIAGNOSIS — E559 Vitamin D deficiency, unspecified: Secondary | ICD-10-CM

## 2024-02-20 ENCOUNTER — Encounter: Payer: Self-pay | Admitting: Family Medicine

## 2024-02-20 ENCOUNTER — Ambulatory Visit (INDEPENDENT_AMBULATORY_CARE_PROVIDER_SITE_OTHER): Admitting: Family Medicine

## 2024-02-20 VITALS — BP 208/106 | HR 77 | Ht 69.0 in | Wt 259.7 lb

## 2024-02-20 DIAGNOSIS — I1A0 Resistant hypertension: Secondary | ICD-10-CM

## 2024-02-20 DIAGNOSIS — E559 Vitamin D deficiency, unspecified: Secondary | ICD-10-CM

## 2024-02-20 DIAGNOSIS — R1013 Epigastric pain: Secondary | ICD-10-CM | POA: Diagnosis not present

## 2024-02-20 DIAGNOSIS — E785 Hyperlipidemia, unspecified: Secondary | ICD-10-CM

## 2024-02-20 DIAGNOSIS — Z7984 Long term (current) use of oral hypoglycemic drugs: Secondary | ICD-10-CM

## 2024-02-20 DIAGNOSIS — E1169 Type 2 diabetes mellitus with other specified complication: Secondary | ICD-10-CM

## 2024-02-20 DIAGNOSIS — F419 Anxiety disorder, unspecified: Secondary | ICD-10-CM | POA: Diagnosis not present

## 2024-02-20 DIAGNOSIS — L409 Psoriasis, unspecified: Secondary | ICD-10-CM

## 2024-02-20 DIAGNOSIS — K760 Fatty (change of) liver, not elsewhere classified: Secondary | ICD-10-CM

## 2024-02-20 LAB — H. PYLORI BREATH TEST: H pylori Breath Test: NEGATIVE

## 2024-02-20 LAB — CBC WITH DIFFERENTIAL/PLATELET
Basophils Absolute: 0 x10E3/uL (ref 0.0–0.2)
Basos: 0 %
EOS (ABSOLUTE): 0.3 x10E3/uL (ref 0.0–0.4)
Eos: 4 %
Hematocrit: 42.7 % (ref 37.5–51.0)
Hemoglobin: 14.2 g/dL (ref 13.0–17.7)
Immature Grans (Abs): 0 x10E3/uL (ref 0.0–0.1)
Immature Granulocytes: 0 %
Lymphocytes Absolute: 3 x10E3/uL (ref 0.7–3.1)
Lymphs: 38 %
MCH: 29.3 pg (ref 26.6–33.0)
MCHC: 33.3 g/dL (ref 31.5–35.7)
MCV: 88 fL (ref 79–97)
Monocytes Absolute: 0.6 x10E3/uL (ref 0.1–0.9)
Monocytes: 8 %
Neutrophils Absolute: 3.9 x10E3/uL (ref 1.4–7.0)
Neutrophils: 50 %
Platelets: 322 x10E3/uL (ref 150–450)
RBC: 4.85 x10E6/uL (ref 4.14–5.80)
RDW: 15.9 % — ABNORMAL HIGH (ref 11.6–15.4)
WBC: 7.9 x10E3/uL (ref 3.4–10.8)

## 2024-02-20 LAB — LIPASE: Lipase: 37 U/L (ref 13–78)

## 2024-02-20 LAB — COMPREHENSIVE METABOLIC PANEL WITH GFR
ALT: 26 IU/L (ref 0–44)
AST: 23 IU/L (ref 0–40)
Albumin: 4.7 g/dL (ref 3.8–4.9)
Alkaline Phosphatase: 70 IU/L (ref 47–123)
BUN/Creatinine Ratio: 10 (ref 9–20)
BUN: 12 mg/dL (ref 6–24)
Bilirubin Total: 0.3 mg/dL (ref 0.0–1.2)
CO2: 21 mmol/L (ref 20–29)
Calcium: 9.8 mg/dL (ref 8.7–10.2)
Chloride: 101 mmol/L (ref 96–106)
Creatinine, Ser: 1.17 mg/dL (ref 0.76–1.27)
Globulin, Total: 2.9 g/dL (ref 1.5–4.5)
Glucose: 108 mg/dL — ABNORMAL HIGH (ref 70–99)
Potassium: 4.6 mmol/L (ref 3.5–5.2)
Sodium: 141 mmol/L (ref 134–144)
Total Protein: 7.6 g/dL (ref 6.0–8.5)
eGFR: 72 mL/min/1.73 (ref >59)

## 2024-02-20 LAB — PSA TOTAL (REFLEX TO FREE): Prostate Specific Ag, Serum: 0.6 ng/mL (ref 0.0–4.0)

## 2024-02-20 MED ORDER — LOSARTAN POTASSIUM 100 MG PO TABS: 100.0000 mg | ORAL_TABLET | Freq: Every day | ORAL | 6 refills | Status: AC

## 2024-02-20 MED ORDER — HYDROCHLOROTHIAZIDE 25 MG PO TABS: 25.0000 mg | ORAL_TABLET | Freq: Every day | ORAL | 6 refills | Status: AC

## 2024-02-20 MED ORDER — METFORMIN HCL 500 MG PO TABS
500.0000 mg | ORAL_TABLET | Freq: Two times a day (BID) | ORAL | 5 refills | Status: AC
Start: 2024-02-20 — End: ?

## 2024-02-20 MED ORDER — ROSUVASTATIN CALCIUM 20 MG PO TABS: 20.0000 mg | ORAL_TABLET | Freq: Every day | ORAL | 6 refills | Status: AC

## 2024-02-20 MED ORDER — OZEMPIC (0.25 OR 0.5 MG/DOSE) 2 MG/3ML ~~LOC~~ SOPN: 0.5000 mg | PEN_INJECTOR | SUBCUTANEOUS | 4 refills | Status: DC

## 2024-02-20 MED ORDER — HYDRALAZINE HCL 25 MG PO TABS: 25.0000 mg | ORAL_TABLET | Freq: Two times a day (BID) | ORAL | 2 refills | Status: DC

## 2024-02-20 NOTE — Patient Instructions (Addendum)
                         Contains text generated by Abridge.                                 Contains text generated by Abridge.     To keep you healthy, please keep in mind the following health maintenance items that you are due for:   Health Maintenance Due  Topic Date Due   OPHTHALMOLOGY EXAM  Never done   Diabetic kidney evaluation - Urine ACR  Never done   Hepatitis B Vaccines 19-59 Average Risk (1 of 3 - 19+ 3-dose series) Never done   Zoster Vaccines- Shingrix (1 of 2) Never done   Colonoscopy  Never done   Pneumococcal Vaccine: 50+ Years (2 of 2 - PPSV23, PCV20, or PCV21) 06/03/2020   DTaP/Tdap/Td (2 - Td or Tdap) 03/10/2023   Influenza Vaccine  Never done   HEMOGLOBIN A1C  01/13/2024     Best Wishes,   Dr. Lang
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
## 2024-02-21 LAB — MICROALBUMIN / CREATININE URINE RATIO
Creatinine, Urine: 248.7 mg/dL
Microalb/Creat Ratio: 44 mg/g{creat} — ABNORMAL HIGH (ref 0–29)
Microalbumin, Urine: 110.2 ug/mL

## 2024-02-21 LAB — CMP14+EGFR
ALT: 31 IU/L (ref 0–44)
AST: 25 IU/L (ref 0–40)
Albumin: 4.7 g/dL (ref 3.8–4.9)
Alkaline Phosphatase: 74 IU/L (ref 47–123)
BUN/Creatinine Ratio: 10 (ref 9–20)
BUN: 11 mg/dL (ref 6–24)
Bilirubin Total: 0.3 mg/dL (ref 0.0–1.2)
CO2: 23 mmol/L (ref 20–29)
Calcium: 9.8 mg/dL (ref 8.7–10.2)
Chloride: 101 mmol/L (ref 96–106)
Creatinine, Ser: 1.09 mg/dL (ref 0.76–1.27)
Globulin, Total: 2.9 g/dL (ref 1.5–4.5)
Glucose: 113 mg/dL — ABNORMAL HIGH (ref 70–99)
Potassium: 4.1 mmol/L (ref 3.5–5.2)
Sodium: 138 mmol/L (ref 134–144)
Total Protein: 7.6 g/dL (ref 6.0–8.5)
eGFR: 78 mL/min/1.73 (ref >59)

## 2024-02-21 LAB — HEMOGLOBIN A1C
Est. average glucose Bld gHb Est-mCnc: 143 mg/dL
Hgb A1c MFr Bld: 6.6 % — ABNORMAL HIGH (ref 4.8–5.6)

## 2024-02-21 LAB — LIPASE: Lipase: 31 U/L (ref 13–78)

## 2024-02-23 DIAGNOSIS — K760 Fatty (change of) liver, not elsewhere classified: Secondary | ICD-10-CM | POA: Insufficient documentation

## 2024-02-23 NOTE — Assessment & Plan Note (Signed)
  Fatty liver (hepatic steatosis) - Monitor liver function tests as part of CMP

## 2024-02-23 NOTE — Assessment & Plan Note (Signed)
  Vitamin D  deficiency Vitamin D  supplementation is ongoing. - Measure vitamin D  levels

## 2024-02-23 NOTE — Assessment & Plan Note (Signed)
 Resistant essential hypertension chronic Blood pressure at 191/102 mmHg indicates poorly controlled hypertension. Medication adherence issues due to confusion with prescription refills. - Increase hydralazine  to 25 mg three times daily - Refer to cardiologist for resistant hypertension - Provide a printed list of medications for clarity - Reinforce adherence to current antihypertensive regimen

## 2024-02-23 NOTE — Assessment & Plan Note (Signed)
 Epigastric and left upper quadrant abdominal pain Chronic condition  Persistent left upper quadrant discomfort, described as pressure. Differential includes irritation from previous hernia repair mesh or other abdominal pathology. - Order abdominal ultrasound - Order complete metabolic panel (CMP) and lipase test

## 2024-02-23 NOTE — Assessment & Plan Note (Signed)
 Type 2 diabetes mellitus chronic Diabetes management includes metformin and Crestor . A1c was previously 6.8%. Inconsistent metformin use due to medication confusion. - Order A1c and urine albumin tests - Restart Ozempic  (semaglutide ) 0.5 mg once weekly - Increase metformin to 500 mg twice daily - Refer to ophthalmology for diabetes eye exam - Perform diabetes foot exam

## 2024-02-23 NOTE — Assessment & Plan Note (Signed)
 Chronic - start crestor  20mg   - Monitor lipid levels

## 2024-02-23 NOTE — Assessment & Plan Note (Signed)
 Psoriasis Chronic  Psoriasis management includes Humira, but insurance coverage issues affect medication access. Symptoms are returning. - Continue Humira 40 mg as insurance allows - Encourage monitoring for any worsening of symptoms -Follow up with dermatology as previously scheduled

## 2024-02-25 ENCOUNTER — Ambulatory Visit: Payer: Self-pay | Admitting: Physician Assistant

## 2024-02-26 ENCOUNTER — Ambulatory Visit
Admission: RE | Admit: 2024-02-26 | Discharge: 2024-02-26 | Disposition: A | Discharge status: Home / Self Care | Source: Ambulatory Visit | Attending: Family Medicine | Admitting: Family Medicine

## 2024-02-26 DIAGNOSIS — R1013 Epigastric pain: Secondary | ICD-10-CM | POA: Insufficient documentation

## 2024-02-28 ENCOUNTER — Ambulatory Visit: Payer: Self-pay | Admitting: Family Medicine

## 2024-02-28 ENCOUNTER — Encounter: Payer: Self-pay | Admitting: Family Medicine

## 2024-03-16 ENCOUNTER — Telehealth: Payer: Self-pay

## 2024-03-16 NOTE — Telephone Encounter (Unsigned)
 Copied from CRM [id number]. Topic: Clinical - Lab/Test Results >> Mar 16, 2024  3:33 PM Rosaria BRAVO wrote: Reason for CRM: pt called requesting to discuss lab results regarding hs stomach    Best contact: [phone]

## 2024-03-17 ENCOUNTER — Other Ambulatory Visit: Payer: Self-pay | Admitting: Family Medicine

## 2024-03-17 DIAGNOSIS — R1012 Left upper quadrant pain: Secondary | ICD-10-CM

## 2024-03-17 DIAGNOSIS — R1013 Epigastric pain: Secondary | ICD-10-CM

## 2024-03-17 NOTE — Telephone Encounter (Signed)
Abdominal CT ordered

## 2024-03-17 NOTE — Telephone Encounter (Signed)
 Patient notified

## 2024-03-18 ENCOUNTER — Ambulatory Visit: Admitting: Internal Medicine

## 2024-03-18 NOTE — Progress Notes (Deleted)
  Cardiology Office Note:  .   Date:  03/18/2024  ID:  Donald Oconnell, DOB 06/13/1965, MRN [id number] PCP: Sharma Coyer, MD  [facility] Providers Cardiologist:  None { Click to update primary MD,subspecialty MD or APP then REFRESH:1}    History of Present Illness: Donald   UZIAH Oconnell is a 58 y.o. male with history of hypertension, type 2 diabetes mellitus, psoriasis, and cirrhosis, who has been referred for evaluation of resistant hypertension by Dr. Sharma.  He was seen earlier this month by Dr. Sharma, at which time he was noted to be severely hypertensive in the setting of inconsistent medication use.  He noted some vague chest discomfort at that time, describing it as a congestion type feeling.  ROS: See HPI  Studies Reviewed: .        *** Risk Assessment/Calculations:   {Does this patient have ATRIAL FIBRILLATION?:[phone]} No BP recorded.  {Refresh Note OR Click here to enter BP  :1}***       Physical Exam:   VS:  There were no vitals taken for this visit.   Wt Readings from Last 3 Encounters:  02/20/24 259 lb 11.2 oz (117.8 kg)  01/11/24 250 lb (113.4 kg)  12/20/23 258 lb 8 oz (117.3 kg)    General:  NAD. Neck: No JVD or HJR. Lungs: Clear to auscultation bilaterally without wheezes or crackles. Heart: Regular rate and rhythm without murmurs, rubs, or gallops. Abdomen: Soft, nontender, nondistended. Extremities: No lower extremity edema.  ASSESSMENT AND PLAN: .    ***    {Are you ordering a CV Procedure (e.g. stress test, cath, DCCV, TEE, etc)?   Press F2        :[id number]}  Dispo: ***  Signed, Lonni Hanson, MD

## 2024-04-01 ENCOUNTER — Ambulatory Visit: Attending: Internal Medicine | Admitting: Internal Medicine

## 2024-04-01 ENCOUNTER — Encounter: Payer: Self-pay | Admitting: Internal Medicine

## 2024-04-01 VITALS — BP 180/84 | HR 80 | Ht 69.0 in | Wt 258.0 lb

## 2024-04-01 DIAGNOSIS — R079 Chest pain, unspecified: Secondary | ICD-10-CM

## 2024-04-01 DIAGNOSIS — I10 Essential (primary) hypertension: Secondary | ICD-10-CM | POA: Diagnosis not present

## 2024-04-01 MED ORDER — PANTOPRAZOLE SODIUM 40 MG PO TBEC: 40.0000 mg | DELAYED_RELEASE_TABLET | Freq: Every day | ORAL | 3 refills | Status: AC

## 2024-04-01 MED ORDER — AMLODIPINE BESYLATE 5 MG PO TABS: 5.0000 mg | ORAL_TABLET | Freq: Every day | ORAL | 3 refills | Status: DC

## 2024-04-01 NOTE — Patient Instructions (Signed)
 Medication Instructions:  Your physician recommends the following medication changes.  START TAKING: Amlodipine 5 mg by mouth daily Protonix 40 mg by mouth daily   *If you need a refill on your cardiac medications before your next appointment, please call your pharmacy*  Lab Work: No labs ordered today    Testing/Procedures: No test ordered today   Follow-Up: At [facility] you and your health needs are our priority.  As part of our continuing mission to provide you with exceptional heart care, our providers are all part of one team.  This team includes your primary Cardiologist (physician) and Advanced Practice Providers or APPs (Physician Assistants and Nurse Practitioners) who all work together to provide you with the care you need, when you need it.  Your next appointment:   1 month(s)  Provider:   You may see Lonni Hanson, MD or one of the following Advanced Practice Providers on your designated Care Team:   Lonni Meager, NP Lesley Maffucci, PA-C Bernardino Bring, PA-C Cadence Dike, PA-C Tylene Lunch, NP Barnie Hila, NP

## 2024-04-01 NOTE — Progress Notes (Signed)
 Cardiology Office Note:  .   Date:  04/02/2024  ID:  [name] Gains, DOB 03-25-1966, MRN [id number] PCP: Sharma Coyer, MD  [facility] Providers Cardiologist:  None     History of Present Illness: Donald   ZYERE Oconnell is a 58 y.o. male with history of hypertension, type 2 diabetes mellitus, subdural hematoma (unclear etiology) s/p burr hole in 2021, psoriasis, and questionable cirrhosis, who has been referred for evaluation of resistant hypertension by Dr. Sharma.  He was seen earlier this month by Dr. Sharma, at which time he was noted to be severely hypertensive in the setting of inconsistent medication use.  He noted some vague chest discomfort at that time, describing it as a congestion type feeling.  Today, Mr. Chisenhall is most concerned about his elevated blood pressure, which has been very difficult to control.  He notes that he was diagnosed with hypertension in his late 30s or 30s and has been on several different medications.  It continues to be quite elevated.  He reports being compliant with his medications and tries to limit his sodium intake.  Mr. Ridlon is also concerned about intermittent chest pain.  It is typically left-sided and feels like congestion.  It seems to resolve with burping.  Drinking milk or eating things with milk seems to worsen the pain.  It has improved some with antacid use.  Is been present for at least a year and has not gotten much worse.  He does not have any exertional chest pain or shortness of breath, palpitations, lightheadedness, or edema.  Mr. Cardell notes that he has difficulty sleeping.  At 1 point, he was supposed to undergo sleep study though this was put on hold due to a high deductible.  ROS: See HPI  Studies Reviewed: Donald   EKG Interpretation Date/Time:  Wednesday April 01 2024 15:55:56 EST Ventricular Rate:  80 PR Interval:  192 QRS Duration:  116 QT Interval:  380 QTC Calculation: 438 R  Axis:   17  Text Interpretation: Normal sinus rhythm Minimal voltage criteria for LVH, may be normal variant ( Cornell product ) Nonspecific T wave abnormality Abnormal ECG When compared with ECG of 13-Aug-2019 07:22, HEART RATE has increased Otherwise no significant change Confirmed by Emili Mcloughlin, Lonni [phone]) on 04/02/2024 12:44:06 PM    Risk Assessment/Calculations:     HYPERTENSION CONTROL Vitals:   04/01/24 1545 04/01/24 1558 04/01/24 1616  BP: (!) 162/102 (!) 164/100 (!) 180/84    The patient's blood pressure is elevated above target today.  In order to address the patient's elevated BP: A new medication was prescribed today.          Physical Exam:   VS:  BP (!) 180/84 (Cuff Size: Large)   Pulse 80   Ht 5' 9 (1.753 m)   Wt 258 lb (117 kg)   SpO2 98%   BMI 38.10 kg/m    Wt Readings from Last 3 Encounters:  04/01/24 258 lb (117 kg)  02/20/24 259 lb 11.2 oz (117.8 kg)  01/11/24 250 lb (113.4 kg)    General:  NAD. Neck: No JVD or HJR. Lungs: Clear to auscultation bilaterally without wheezes or crackles. Heart: Regular rate and rhythm without murmurs, rubs, or gallops. Abdomen: Soft, nontender, nondistended. Extremities: No lower extremity edema.  ASSESSMENT AND PLAN: .    Uncontrolled hypertension: Mr. Legan reports having difficult to control hypertension for about 30 years.  He is currently on HCTZ and losartan .  At 1 point,  it appears that he was on amlodipine though for unclear reasons this was stopped.  He does not believe he was intolerant to it.  I have recommended resuming amlodipine 5 mg daily with up titration as needed.  I suspect that he may need further antihypertensive medications in the future.  If blood pressure remains elevated, workup for secondary hypertension, including renal artery Doppler and serum aldosterone/plasma renin activity ratio, will need to be considered at follow-up.  Ultimately, if his blood pressure remains difficult to control,  referral to the hypertension clinic for consideration of renal artery denervation could be considered.  I encouraged Mr. Disano to continue minimizing his sodium intake.  Sleep study would also be worthwhile to exclude sleep apnea.  This has previously been discussed with Mr. Callow though he wished to defer this on account of high deductible and financial constraints.  We will need to readdress this at follow-up, particularly if his blood pressure remains elevated.  Chest pain: Chest pain is not exertional and seems to be exacerbated by drinking/eating foods containing milk.  Query GERD and/or milk intolerance.  I have recommended an empiric trial of pantoprazole 40 mg daily (omeprazole  previously recommended but not currently being taken).  If symptoms do not improve, ischemia testing will need to be considered at follow-up.  Defer adding aspirin with concern for GERD/GI upset as well as history of subdural hematoma of uncertain etiology.    Dispo: Return to clinic in 1 month.  Signed, Lonni Hanson, MD

## 2024-04-02 ENCOUNTER — Encounter: Payer: Self-pay | Admitting: Internal Medicine

## 2024-04-02 DIAGNOSIS — R079 Chest pain, unspecified: Secondary | ICD-10-CM | POA: Insufficient documentation

## 2024-04-03 ENCOUNTER — Ambulatory Visit: Admission: RE | Admit: 2024-04-03 | Source: Ambulatory Visit

## 2024-05-05 ENCOUNTER — Ambulatory Visit: Admitting: Family Medicine

## 2024-05-05 ENCOUNTER — Encounter: Payer: Self-pay | Admitting: Family Medicine

## 2024-05-05 ENCOUNTER — Telehealth: Payer: Self-pay

## 2024-05-05 ENCOUNTER — Other Ambulatory Visit (HOSPITAL_COMMUNITY): Payer: Self-pay

## 2024-05-05 VITALS — BP 161/85 | HR 77 | Ht 69.0 in | Wt 258.8 lb

## 2024-05-05 DIAGNOSIS — E1169 Type 2 diabetes mellitus with other specified complication: Secondary | ICD-10-CM | POA: Diagnosis not present

## 2024-05-05 DIAGNOSIS — F17290 Nicotine dependence, other tobacco product, uncomplicated: Secondary | ICD-10-CM

## 2024-05-05 DIAGNOSIS — R1012 Left upper quadrant pain: Secondary | ICD-10-CM

## 2024-05-05 DIAGNOSIS — K760 Fatty (change of) liver, not elsewhere classified: Secondary | ICD-10-CM

## 2024-05-05 DIAGNOSIS — R1013 Epigastric pain: Secondary | ICD-10-CM

## 2024-05-05 DIAGNOSIS — F419 Anxiety disorder, unspecified: Secondary | ICD-10-CM | POA: Diagnosis not present

## 2024-05-05 DIAGNOSIS — Z7984 Long term (current) use of oral hypoglycemic drugs: Secondary | ICD-10-CM

## 2024-05-05 DIAGNOSIS — I1A0 Resistant hypertension: Secondary | ICD-10-CM

## 2024-05-05 DIAGNOSIS — E785 Hyperlipidemia, unspecified: Secondary | ICD-10-CM | POA: Diagnosis not present

## 2024-05-05 MED ORDER — HYDRALAZINE HCL 50 MG PO TABS: 50.0000 mg | ORAL_TABLET | Freq: Two times a day (BID) | ORAL | 2 refills | Status: AC

## 2024-05-05 MED ORDER — OZEMPIC (0.25 OR 0.5 MG/DOSE) 2 MG/3ML ~~LOC~~ SOPN: 0.2500 mg | PEN_INJECTOR | SUBCUTANEOUS | 2 refills | Status: DC

## 2024-05-05 NOTE — Assessment & Plan Note (Addendum)
 Chronic epigastric and left upper quadrant abdominal pain Chronic pain persists, especially when sitting. Previous ultrasound showed fatty liver changes. Differential includes hiatal hernia. CT ordered to further evaluate. - Ordered CT of the abdomen - will plan referral to gastroenterology for further evaluation and possible EGD if normal CT abdomen

## 2024-05-05 NOTE — Assessment & Plan Note (Signed)
 Chronic  Blood pressure remains elevated at 161/85 mmHg. Current regimen includes hydrochlorothiazide , hydralazine , losartan , and discontinued amlodipine  due to leg swelling. Cardiology appointment scheduled for further management. - Increased hydralazine  to 50 mg twice daily - Continue hydrochlorothiazide  25 mg daily - Continue losartan  100 mg daily - Follow up with cardiology on January 5th, 2026

## 2024-05-05 NOTE — Assessment & Plan Note (Signed)
 Type 2 diabetes mellitus with other specified complication Chronic Type 2 diabetes is well-controlled with an A1c of 6.6%. Insurance issues prevent starting semaglutide . - Continue metformin  500 mg twice daily - Attempted to obtain prior authorization for Ozempic  0.25 mg weekly
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
## 2024-05-05 NOTE — Assessment & Plan Note (Signed)
  Fatty liver (hepatic steatosis) - Monitor liver function tests as part of CMP

## 2024-05-05 NOTE — Telephone Encounter (Signed)
 Pharmacy Patient Advocate Encounter   Received notification from [employer] that prior authorization for OZEMPIC , 0.25 OR 0.5 MG/DOSE,) 2 MG/3ML SOPN  is required/requested.   Insurance verification completed.   The patient is insured through [facility].   Per test claim: PA required; PA submitted to above mentioned insurance via Prompt PA Key/confirmation #/EOC [id number] Status is pending

## 2024-05-05 NOTE — Assessment & Plan Note (Signed)
 Chronic - continue crestor  20mg   - Monitor lipid levels

## 2024-05-05 NOTE — Assessment & Plan Note (Signed)
 Chronic  Previously on hydroxyzine  25-50mg  PRN

## 2024-05-05 NOTE — Patient Instructions (Signed)
 To keep you healthy, please keep in mind the following health maintenance items that you are due for:   Health Maintenance Due  Topic Date Due   OPHTHALMOLOGY EXAM  Never done   Hepatitis B Vaccines 19-59 Average Risk (1 of 3 - 19+ 3-dose series) Never done   Zoster Vaccines- Shingrix (1 of 2) Never done   Colonoscopy  Never done   Pneumococcal Vaccine: 50+ Years (2 of 2 - PPSV23, PCV20, or PCV21) 06/03/2020   DTaP/Tdap/Td (2 - Td or Tdap) 03/10/2023   Influenza Vaccine  Never done     Best Wishes,   Dr. Lang

## 2024-05-06 ENCOUNTER — Ambulatory Visit: Payer: Self-pay | Admitting: Family Medicine

## 2024-05-06 LAB — CREATININE, SERUM
Creatinine, Ser: 1.11 mg/dL (ref 0.76–1.27)
eGFR: 76 mL/min/1.73 (ref >59)

## 2024-05-07 ENCOUNTER — Other Ambulatory Visit (HOSPITAL_COMMUNITY): Payer: Self-pay

## 2024-05-07 NOTE — Telephone Encounter (Signed)
 Pharmacy Patient Advocate Encounter  Received notification from [employer] that Prior Authorization for Ozempic  has been CANCELLED due to prior auth not being required at this time.    PA #/Case ID/Reference #: [id number]

## 2024-05-08 ENCOUNTER — Ambulatory Visit: Admission: RE | Admit: 2024-05-08 | Source: Ambulatory Visit

## 2024-05-08 NOTE — Telephone Encounter (Signed)
Spoke with patient, he has picked this up.

## 2024-05-08 NOTE — Telephone Encounter (Signed)
 Please advise patient to get this prescription filled. Looks like no prior authorization was needed
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
## 2024-05-25 ENCOUNTER — Ambulatory Visit: Attending: Physician Assistant | Admitting: Physician Assistant

## 2024-05-27 ENCOUNTER — Ambulatory Visit: Admitting: Nurse Practitioner

## 2024-06-04 ENCOUNTER — Ambulatory Visit: Admission: RE | Admit: 2024-06-04 | Source: Ambulatory Visit

## 2024-06-04 ENCOUNTER — Telehealth: Payer: Self-pay

## 2024-06-04 NOTE — Telephone Encounter (Signed)
 Copied from CRM [id number]. Topic: Clinical - Medical Advice >> Jun 04, 2024  8:24 AM [name] wrote: Reason for CRM:  Donald Oconnell is calling to let Dr. Sharma that his appointment for CT Abdomen was canceled 2 times because of insurance. He has [employer]; Subscriber ID: [id number]; Group #: [id number] This note is on referral: Called patient at [phone]. vm is not set up. unable to leave a msg. need to r/s appt. wrong tax id on pre sert. lvm for md office  shara has wrong tax id number / emailed md office. left msg on vm  10/30 LVM cd  Please call Donald Oconnell at [phone] when this is fixed and when he Oconnell go have the CT Scan. Thanks

## 2024-06-08 ENCOUNTER — Ambulatory Visit: Payer: Self-pay

## 2024-06-08 NOTE — Telephone Encounter (Signed)
 Spoke with [facility], rep provided me with correct information needed for patient's order ( [facility] outpatient img Tax ID and NPI) Listed below  Tax ID- [id number] NPI- [id number]  Rep states this will need to be updated on order for CT abd in order for patient to be able to schedule

## 2024-06-08 NOTE — Telephone Encounter (Signed)
 FYI Only or Action Required?: Action required by provider: update on patient condition and lab or test result follow-up needed.  Patient was last seen in primary care on 05/05/2024 by Sharma Coyer, MD.  Called Nurse Triage reporting Follow-up.   Triage Disposition: Call PCP When Office is Open  Patient/caregiver understands and will follow disposition?: Yes   Message from [facility] D sent at 06/08/2024 11:19 AM EST  Pt says he's suppose to be getting a CT scan on his left side. He says they keep canceling his appts. Says his side hurts more.   Reason for Disposition  Caller requesting routine or non-urgent lab result  Answer Assessment - Initial Assessment Questions 1. REASON FOR CALL or QUESTION: What is your reason for calling today? or How can I best     Pt calling to report CT scan cancelled 01/15 d/t insurance requiring PA and no covering imaging. Pt needs PA for scan, please advise.  Protocols used: PCP Call - No Triage-A-AH

## 2024-06-09 NOTE — Telephone Encounter (Signed)
 Pt reports he is aware.

## 2024-06-10 ENCOUNTER — Ambulatory Visit

## 2024-06-12 ENCOUNTER — Ambulatory Visit
Admission: RE | Admit: 2024-06-12 | Discharge: 2024-06-12 | Disposition: A | Source: Ambulatory Visit | Attending: Family Medicine

## 2024-06-12 DIAGNOSIS — R1013 Epigastric pain: Secondary | ICD-10-CM | POA: Diagnosis present

## 2024-06-12 DIAGNOSIS — R1012 Left upper quadrant pain: Secondary | ICD-10-CM | POA: Insufficient documentation

## 2024-06-12 MED ORDER — IOHEXOL 300 MG/ML  SOLN
100.0000 mL | Freq: Once | INTRAMUSCULAR | Status: AC | PRN
  Administered 2024-06-12: 100 mL via INTRAVENOUS

## 2024-06-19 ENCOUNTER — Other Ambulatory Visit: Payer: Self-pay | Admitting: Family Medicine

## 2024-06-19 ENCOUNTER — Ambulatory Visit: Payer: Self-pay | Admitting: Family Medicine

## 2024-06-19 DIAGNOSIS — R1012 Left upper quadrant pain: Secondary | ICD-10-CM

## 2024-06-19 DIAGNOSIS — R1013 Epigastric pain: Secondary | ICD-10-CM

## 2024-06-19 NOTE — Progress Notes (Signed)
 GI referral for chronic LUQ and epigastric abdominal pain

## 2024-06-22 ENCOUNTER — Ambulatory Visit: Payer: Self-pay

## 2024-06-22 NOTE — Telephone Encounter (Signed)
 FYI Only or Action Required?: FYI only for provider: appointment scheduled on 06/25/2024 4:00 PMSimmons-Robinson, Makiera, [facility] (outside [facility]po) .  Patient was last seen in primary care on 05/05/2024 by Sharma Coyer, MD.  Called Nurse Triage reporting Recurrent Skin Infections.  Symptoms began 2 months .  Interventions attempted: Other: bandaging .  Symptoms are: stable.  Triage Disposition: See Physician Within 24 Hours  Patient/caregiver understands and will follow disposition?: No, wishes to speak with PCP  Reason for Disposition  [1] Boil AND [2] diabetes mellitus or weak immune system (e.g., HIV positive, cancer chemo, splenectomy, organ transplant, chronic steroids)  Answer Assessment - Initial Assessment Questions Patient reports symptoms started 2 months ago big boil on inner left thigh. Feels like a knot now but still wont heal. Still draining pus and blood  sometimes. Skin looks the same other than the boil.sore to touch . Had had boils before. The boil is size of dime to nickel. wearing bandage on the boil .  Cleaning the site as well. Hurts to touch and if pressure applied.  Can't come into the office until Thursday booked appointment Thursday per patient request 06/25/2024 4:00 PM Simmons-Robinson, Coyer, MD [facility] routing to office was booked outside dispo patient working this week until appointment will seek UC if worsening in meantime    1. APPEARANCE of BOIL: What does the boil look like?      Inner left thigh   3. NUMBER: How many boils are there?      1 5. ONSET: When did the boil start?     2 months  6. PAIN: Is there any pain? If Yes, ask: How bad is the pain?   (Scale 1-10; or mild, moderate, severe)     Tender to touch  7. FEVER: Do you have a fever? If Yes, ask: What is it, how was it measured, and when did it start?      Denies   9. OTHER SYMPTOMS: Do you have any other symptoms? (e.g., shaking  chills, weakness, rash elsewhere on body)      Patient denies fever, chills, vomiting  Protocols used: Boil (Skin Abscess)-A-AH  Message from Antwanette L sent at 06/22/2024  4:14 PM EST  Reason for Triage: The patient reports a boil on the inner left thigh causing swelling and pain. The patient is requesting to schedule an appointment for evaluation.

## 2024-06-22 NOTE — Telephone Encounter (Signed)
 Patient call note and symptoms reviewed. Agree with scheduled appt. Will evaluate during OV

## 2024-06-25 ENCOUNTER — Ambulatory Visit: Admitting: Family Medicine

## 2024-06-25 ENCOUNTER — Encounter: Payer: Self-pay | Admitting: Family Medicine

## 2024-06-25 ENCOUNTER — Encounter: Payer: Self-pay | Admitting: *Deleted

## 2024-06-25 ENCOUNTER — Ambulatory Visit

## 2024-06-25 VITALS — BP 160/88 | HR 80 | Temp 98.2°F | Ht 69.0 in | Wt 262.0 lb

## 2024-06-25 VITALS — BP 158/90 | HR 89 | Temp 98.3°F | Ht 69.0 in | Wt 263.4 lb

## 2024-06-25 DIAGNOSIS — E1169 Type 2 diabetes mellitus with other specified complication: Secondary | ICD-10-CM

## 2024-06-25 DIAGNOSIS — K5900 Constipation, unspecified: Secondary | ICD-10-CM

## 2024-06-25 DIAGNOSIS — R1012 Left upper quadrant pain: Secondary | ICD-10-CM

## 2024-06-25 DIAGNOSIS — L732 Hidradenitis suppurativa: Secondary | ICD-10-CM | POA: Insufficient documentation

## 2024-06-25 DIAGNOSIS — R194 Change in bowel habit: Secondary | ICD-10-CM

## 2024-06-25 DIAGNOSIS — E1159 Type 2 diabetes mellitus with other circulatory complications: Secondary | ICD-10-CM

## 2024-06-25 DIAGNOSIS — L409 Psoriasis, unspecified: Secondary | ICD-10-CM

## 2024-06-25 DIAGNOSIS — I1A0 Resistant hypertension: Secondary | ICD-10-CM

## 2024-06-25 DIAGNOSIS — R140 Abdominal distension (gaseous): Secondary | ICD-10-CM

## 2024-06-25 DIAGNOSIS — E119 Type 2 diabetes mellitus without complications: Secondary | ICD-10-CM

## 2024-06-25 MED ORDER — DOXYCYCLINE HYCLATE 100 MG PO TABS: 100.0000 mg | ORAL_TABLET | Freq: Two times a day (BID) | ORAL | 0 refills | Status: AC

## 2024-06-25 MED ORDER — NA SULFATE-K SULFATE-MG SULF 17.5-3.13-1.6 GM/177ML PO SOLN: 1.0000 | Freq: Once | ORAL | 0 refills | Status: AC

## 2024-06-25 MED ORDER — LOSARTAN POTASSIUM 100 MG PO TABS: 100.0000 mg | ORAL_TABLET | Freq: Every day | ORAL | 2 refills | Status: AC

## 2024-06-25 NOTE — Assessment & Plan Note (Signed)
 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 Differential includes hidradenitis suppurativa (HS) due to recurrent boils and drainage. No signs of dangerous infection. - Prescribed doxycycline  for 10 days to manage infection and promote drainage. - Advised follow-up with dermatology for potential HS diagnosis and treatment options, including injectable treatments.

## 2024-06-25 NOTE — Patient Instructions (Signed)
 To keep you healthy, please keep in mind the following health maintenance items that you are due for:   Health Maintenance Due  Topic Date Due   OPHTHALMOLOGY EXAM  Never done   Hepatitis B Vaccines 19-59 Average Risk (1 of 3 - 19+ 3-dose series) Never done   Zoster Vaccines- Shingrix (1 of 2) Never done   Colonoscopy  Never done   Pneumococcal Vaccine: 50+ Years (2 of 2 - PPSV23, PCV20, or PCV21) 06/03/2020   DTaP/Tdap/Td (2 - Td or Tdap) 03/10/2023   Influenza Vaccine  Never done   COVID-19 Vaccine (4 - 2025-26 season) 01/20/2024     Best Wishes,   Dr. Lang

## 2024-06-25 NOTE — Addendum Note (Signed)
 Addended by: Antonious Omahoney, CHAN on: 06/25/2024 02:59 PM   Modules accepted: Orders

## 2024-06-25 NOTE — Assessment & Plan Note (Signed)
 Chronic  Has stopped ozempic  to see if constipation is improved  Continue to follow up with gastroenterology as scheduled  Pt has colonoscopy coming up

## 2024-06-25 NOTE — Assessment & Plan Note (Signed)
 Chronic  Recent A1c of 6.6%. Currently on metformin  and Crestor  for management. - Continue metformin  500 mg twice daily. - Continue Crestor  20 mg daily. - Will schedule follow-up A1c test in April.

## 2024-06-25 NOTE — Assessment & Plan Note (Signed)
 Chronic - continue crestor  20mg 

## 2024-06-25 NOTE — Assessment & Plan Note (Signed)
 Chronic  Hypertension with recent blood pressure reading of 157/85 mmHg. Previously on multiple antihypertensive medications including hydralazine , hydrochlorothiazide , and amlodipine . Losartan  was not being taken but is recommended for blood pressure control and renal protection in diabetes. - Restarted losartan  100 mg daily. - Continue hydralazine  50 mg twice daily. - Continue hydrochlorothiazide  25 mg daily. - Continue amlodipine  5 mg daily. - f/u with cardiology

## 2024-06-25 NOTE — Assessment & Plan Note (Signed)
 Chronic  Managed with Amjevita. Dermatology follow-up is annual, with last visit approximately two months ago. - Continue Amjevita as prescribed. - Advised follow-up with dermatology for ongoing management and potential treatment adjustments.
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
## 2024-06-25 NOTE — Addendum Note (Signed)
 Addended by: Zyaire Dumas, CHAN on: 06/25/2024 03:05 PM   Modules accepted: Orders

## 2024-06-25 NOTE — Progress Notes (Signed)
 "  Established Patient Office Visit  Patient ID: Donald Oconnell, male    DOB: 03/16/1966  Age: 59 y.o. MRN: [id number] PCP: Sharma Coyer, MD  Chief Complaint  Patient presents with   Recurrent Skin Infections    Patient present with boil on left inner thigh times 2 months, recurrently opens up and drains.     Subjective:     HPI  Discussed the use of AI scribe software for clinical note transcription with the patient, who gave verbal consent to proceed.  History of Present Illness Donald Oconnell is a 59 year old male with psoriasis and hypertension who presents with a boil on his inner thigh and concerns about medication side effects.  He has a boil on his inner thigh that has been draining for several months, initially starting last summer. The boil was 'dripping real, real, real good about two months ago' but has since become sore over the past two weeks. It was initially draining normally but has now become painful.  He has a history of psoriasis and is currently on Amjevita for treatment. He is also taking methotrexate and mentions taking 'six different type of pills' for his conditions.  He stopped taking Ozempic  about a month ago due to side effects, specifically pain in his side. The pain was present even before starting Ozempic . He is also taking metformin  500 mg twice a day for diabetes management.  He is on multiple medications for hypertension, including hydralazine  50 mg twice a day, hydrochlorothiazide  25 mg daily, and recently resumed amlodipine  5 mg. His blood pressure has improved since he started taking his medication regularly.  He experiences constipation and mentions difficulty using Bufferin.  He is scheduled for a colonoscopy next Friday and has concerns about the procedure. He has been experiencing side pain for a long time and is unsure of the cause.     ROS    Objective:     BP (!) 160/88 (Cuff Size: Large)   Pulse 80   Temp 98.2 F  (36.8 C) (Oral)   Ht 5' 9 (1.753 m)   Wt 262 lb (118.8 kg)   SpO2 98%   BMI 38.69 kg/m  BP Readings from Last 3 Encounters:  06/25/24 (!) 160/88  06/25/24 (!) 158/90  05/05/24 (!) 161/85   Wt Readings from Last 3 Encounters:  06/25/24 262 lb (118.8 kg)  06/25/24 263 lb 6 oz (119.5 kg)  05/05/24 258 lb 12.8 oz (117.4 kg)      Physical Exam Vitals reviewed.  Constitutional:      General: He is not in acute distress.    Appearance: Normal appearance. He is not ill-appearing.  Cardiovascular:     Rate and Rhythm: Normal rate and regular rhythm.  Pulmonary:     Effort: Pulmonary effort is normal. No respiratory distress.     Breath sounds: No wheezing, rhonchi or rales.  Skin:     Neurological:     Mental Status: He is alert and oriented to person, place, and time.  Psychiatric:        Mood and Affect: Mood normal.        Behavior: Behavior normal.      No results found for any visits on 06/25/24.  Last metabolic panel Lab Results  Component Value Date   GLUCOSE 113 (H) 02/20/2024   NA 138 02/20/2024   K 4.1 02/20/2024   CL 101 02/20/2024   CO2 23 02/20/2024   BUN 11 02/20/2024  CREATININE 1.11 05/05/2024   EGFR 76 05/05/2024   CALCIUM  9.8 02/20/2024   PROT 7.6 02/20/2024   ALBUMIN 4.7 02/20/2024   LABGLOB 2.9 02/20/2024   BILITOT 0.3 02/20/2024   ALKPHOS 74 02/20/2024   AST 25 02/20/2024   ALT 31 02/20/2024   ANIONGAP 8 12/04/2020   Last lipids Lab Results  Component Value Date   CHOL 197 07/16/2023   HDL 35 (L) 07/16/2023   LDLCALC 116 (H) 07/16/2023   TRIG 263 (H) 07/16/2023   CHOLHDL 5.6 (H) 07/16/2023   Last hemoglobin A1c Lab Results  Component Value Date   HGBA1C 6.6 (H) 02/20/2024      The 10-year ASCVD risk score (Arnett DK, et al., 2019) is: 54%  Outpatient Encounter Medications as of 06/25/2024  Medication Sig   AMJEVITA 40 MG/0.4ML SOAJ Inject 40 mg every 14 days   amLODipine  (NORVASC ) 5 MG tablet Take 5 mg by mouth daily.    doxycycline  (VIBRA -TABS) 100 MG tablet Take 1 tablet (100 mg total) by mouth 2 (two) times daily for 10 days.   hydrALAZINE  (APRESOLINE ) 50 MG tablet Take 1 tablet (50 mg total) by mouth 2 (two) times daily.   hydrochlorothiazide  (HYDRODIURIL ) 25 MG tablet Take 1 tablet (25 mg total) by mouth daily.   losartan  (COZAAR ) 100 MG tablet Take 1 tablet (100 mg total) by mouth daily.   losartan  (COZAAR ) 100 MG tablet Take 1 tablet (100 mg total) by mouth daily.   metFORMIN  (GLUCOPHAGE ) 500 MG tablet Take 1 tablet (500 mg total) by mouth 2 (two) times daily with a meal.   pantoprazole  (PROTONIX ) 40 MG tablet Take 1 tablet (40 mg total) by mouth daily.   rosuvastatin  (CRESTOR ) 20 MG tablet Take 1 tablet (20 mg total) by mouth daily.   Na Sulfate-K Sulfate-Mg Sulfate concentrate (SUPREP) 17.5-3.13-1.6 GM/177ML SOLN Take 1 kit (354 mLs total) by mouth once for 1 dose.   [DISCONTINUED] hydrOXYzine  (VISTARIL ) 25 MG capsule Take 1-2 capsules (25-50 mg total) by mouth at bedtime as needed. (Patient not taking: Reported on 06/25/2024)   [DISCONTINUED] Semaglutide ,0.25 or 0.5MG /DOS, (OZEMPIC , 0.25 OR 0.5 MG/DOSE,) 2 MG/3ML SOPN Inject 0.25 mg into the skin once a week. (Patient not taking: Reported on 06/25/2024)   No facility-administered encounter medications on file as of 06/25/2024.       Assessment & Plan:   Problem List Items Addressed This Visit     Diabetes mellitus (HCC)   Chronic  Recent A1c of 6.6%. Currently on metformin  and Crestor  for management. - Continue metformin  500 mg twice daily. - Continue Crestor  20 mg daily. - Will schedule follow-up A1c test in April.      Relevant Medications   losartan  (COZAAR ) 100 MG tablet   Hyperlipidemia associated with type 2 diabetes mellitus (HCC)   Chronic - continue crestor  20mg         Relevant Medications   amLODipine  (NORVASC ) 5 MG tablet   losartan  (COZAAR ) 100 MG tablet   Hypertension associated with diabetes (HCC) - Primary   Chronic   Hypertension with recent blood pressure reading of 157/85 mmHg. Previously on multiple antihypertensive medications including hydralazine , hydrochlorothiazide , and amlodipine . Losartan  was not being taken but is recommended for blood pressure control and renal protection in diabetes. - Restarted losartan  100 mg daily. - Continue hydralazine  50 mg twice daily. - Continue hydrochlorothiazide  25 mg daily. - Continue amlodipine  5 mg daily. - f/u with cardiology       Relevant Medications   amLODipine  (NORVASC ) 5  MG tablet   losartan  (COZAAR ) 100 MG tablet   LUQ abdominal pain   Chronic  Has stopped ozempic  to see if constipation is improved  Continue to follow up with gastroenterology as scheduled  Pt has colonoscopy coming up       Psoriasis   Chronic  Managed with Amjevita. Dermatology follow-up is annual, with last visit approximately two months ago. - Continue Amjevita as prescribed. - Advised follow-up with dermatology for ongoing management and potential treatment adjustments.      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 Differential includes hidradenitis suppurativa (HS) due to recurrent boils and drainage. No signs of dangerous infection. - Prescribed doxycycline  for 10 days to manage infection and promote drainage. - Advised follow-up with dermatology for potential HS diagnosis and treatment options, including injectable treatments.      Relevant Medications   doxycycline  (VIBRA -TABS) 100 MG tablet    Assessment and Plan Assessment & Plan        Return in about 3 months (around 09/22/2024) for Chronic F/U, DM, HTN, HS.    Rockie Agent, MD [facility]  "

## 2024-07-03 ENCOUNTER — Encounter: Admission: RE | Payer: Self-pay | Source: Home / Self Care

## 2024-07-03 ENCOUNTER — Ambulatory Visit: Admission: RE | Admit: 2024-07-03 | Source: Home / Self Care

## 2024-07-27 ENCOUNTER — Ambulatory Visit

## 2024-08-03 ENCOUNTER — Ambulatory Visit: Admitting: Family Medicine

## 2024-09-28 ENCOUNTER — Ambulatory Visit: Admitting: Family Medicine
